# Patient Record
Sex: Female | Born: 1990
Health system: Southern US, Community
[De-identification: ages and names within clinical notes are randomized; demographics above are authoritative.]

## PROBLEM LIST (undated history)

## (undated) ENCOUNTER — Ambulatory Visit (HOSPITAL_COMMUNITY): Payer: Medicaid Other

## (undated) DIAGNOSIS — Z789 Other specified health status: Secondary | ICD-10-CM

## (undated) DIAGNOSIS — I959 Hypotension, unspecified: Secondary | ICD-10-CM

---

## 2017-10-31 ENCOUNTER — Ambulatory Visit: Payer: Self-pay | Admitting: Family Medicine

## 2017-11-13 ENCOUNTER — Ambulatory Visit: Payer: Self-pay | Admitting: Family Medicine

## 2018-09-02 ENCOUNTER — Other Ambulatory Visit: Payer: Self-pay

## 2018-09-02 ENCOUNTER — Inpatient Hospital Stay (HOSPITAL_COMMUNITY)
Admission: AD | Admit: 2018-09-02 | Discharge: 2018-09-02 | Disposition: A | Discharge status: Home / Self Care | Payer: Medicaid Other | Attending: Obstetrics & Gynecology | Admitting: Obstetrics & Gynecology

## 2018-09-02 ENCOUNTER — Inpatient Hospital Stay (HOSPITAL_COMMUNITY): Payer: Medicaid Other

## 2018-09-02 ENCOUNTER — Encounter (HOSPITAL_COMMUNITY): Payer: Self-pay | Admitting: Student

## 2018-09-02 DIAGNOSIS — Z3A01 Less than 8 weeks gestation of pregnancy: Secondary | ICD-10-CM | POA: Diagnosis not present

## 2018-09-02 DIAGNOSIS — O9989 Other specified diseases and conditions complicating pregnancy, childbirth and the puerperium: Secondary | ICD-10-CM | POA: Insufficient documentation

## 2018-09-02 DIAGNOSIS — O209 Hemorrhage in early pregnancy, unspecified: Secondary | ICD-10-CM | POA: Insufficient documentation

## 2018-09-02 DIAGNOSIS — O26851 Spotting complicating pregnancy, first trimester: Secondary | ICD-10-CM | POA: Diagnosis not present

## 2018-09-02 DIAGNOSIS — R1030 Lower abdominal pain, unspecified: Secondary | ICD-10-CM | POA: Diagnosis not present

## 2018-09-02 LAB — CBC
HCT: 37.1 % (ref 36.0–46.0)
Hemoglobin: 12.6 g/dL (ref 12.0–15.0)
MCH: 29.8 pg (ref 26.0–34.0)
MCHC: 34 g/dL (ref 30.0–36.0)
MCV: 87.7 fL (ref 80.0–100.0)
Platelets: 238 10*3/uL (ref 150–400)
RBC: 4.23 MIL/uL (ref 3.87–5.11)
RDW: 11.6 % (ref 11.5–15.5)
WBC: 8.4 10*3/uL (ref 4.0–10.5)
nRBC: 0 % (ref 0.0–0.2)

## 2018-09-02 LAB — WET PREP, GENITAL
Clue Cells Wet Prep HPF POC: NONE SEEN
Sperm: NONE SEEN
Trich, Wet Prep: NONE SEEN
Yeast Wet Prep HPF POC: NONE SEEN

## 2018-09-02 LAB — HCG, QUANTITATIVE, PREGNANCY: hCG, Beta Chain, Quant, S: 7878 m[IU]/mL — ABNORMAL HIGH (ref <5)

## 2018-09-02 LAB — POCT PREGNANCY, URINE: Preg Test, Ur: POSITIVE — AB

## 2018-09-02 NOTE — MAU Provider Note (Signed)
Chief Complaint: Vaginal Bleeding and Possible Pregnancy   First Provider Initiated Contact with Patient 09/02/18 1843     SUBJECTIVE HPI: Dawn Bullock is a 28 y.o. G2P1001 at [redacted]w[redacted]d who presents to Maternity Admissions reporting vaginal bleeding. Symptoms started this afternoon. Reports brown blood on toilet paper when she wipes. Not bleeding into a pad or passing clots. Also reports mild lower abdominal cramping. No recent intercourse.   Location: abdomen Quality: cramping Severity: 4/10 on pain scale Duration: 2 days Timing: intermittent Modifying factors: none Associated signs and symptoms: vaginal bleeding  History reviewed. No pertinent past medical history. OB History  Gravida Para Term Preterm AB Living  2 1 1     1   SAB TAB Ectopic Multiple Live Births          1    # Outcome Date GA Lbr Len/2nd Weight Sex Delivery Anes PTL Lv  2 Current           1 Term 2016     Vag-Spont   LIV   History reviewed. No pertinent surgical history. Social History   Socioeconomic History  . Marital status: Married    Spouse name: Not on file  . Number of children: Not on file  . Years of education: Not on file  . Highest education level: Not on file  Occupational History  . Not on file  Social Needs  . Financial resource strain: Not on file  . Food insecurity    Worry: Not on file    Inability: Not on file  . Transportation needs    Medical: Not on file    Non-medical: Not on file  Tobacco Use  . Smoking status: Never Smoker  . Smokeless tobacco: Never Used  Substance and Sexual Activity  . Alcohol use: Not Currently  . Drug use: Not Currently  . Sexual activity: Not Currently  Lifestyle  . Physical activity    Days per week: Not on file    Minutes per session: Not on file  . Stress: Not on file  Relationships  . Social Herbalist on phone: Not on file    Gets together: Not on file    Attends religious service: Not on file    Active member of club  or organization: Not on file    Attends meetings of clubs or organizations: Not on file    Relationship status: Not on file  . Intimate partner violence    Fear of current or ex partner: Not on file    Emotionally abused: Not on file    Physically abused: Not on file    Forced sexual activity: Not on file  Other Topics Concern  . Not on file  Social History Narrative  . Not on file   History reviewed. No pertinent family history. No current facility-administered medications on file prior to encounter.    No current outpatient medications on file prior to encounter.   No Known Allergies  I have reviewed patient's Past Medical Hx, Surgical Hx, Family Hx, Social Hx, medications and allergies.   Review of Systems  Constitutional: Negative.   Gastrointestinal: Positive for abdominal pain. Negative for constipation, diarrhea, nausea and vomiting.  Genitourinary: Positive for vaginal bleeding. Negative for dysuria and vaginal discharge.    OBJECTIVE Patient Vitals for the past 24 hrs:  BP Temp Pulse Resp SpO2 Weight  09/02/18 1957 119/63 - 76 - - -  09/02/18 1827 124/65 - - - - -  09/02/18  1825 (!) 147/81 98 F (36.7 C) 92 16 100 % 69.4 kg   Constitutional: Well-developed, well-nourished female in no acute distress.  Cardiovascular: normal rate & rhythm, no murmur Respiratory: normal rate and effort. Lung sounds clear throughout GI: Abd soft, non-tender, Pos BS x 4. No guarding or rebound tenderness MS: Extremities nontender, no edema, normal ROM Neurologic: Alert and oriented x 4.  GU:     SPECULUM EXAM: NEFG, minimal amount of brown mucoid blood. No active bleeding. Cervix pink/smooth  BIMANUAL: No CMT. cervix closed; uterus normal size, no adnexal tenderness or masses.    LAB RESULTS Results for orders placed or performed during the hospital encounter of 09/02/18 (from the past 24 hour(s))  Pregnancy, urine POC     Status: Abnormal   Collection Time: 09/02/18  5:37 PM   Result Value Ref Range   Preg Test, Ur POSITIVE (A) NEGATIVE  CBC     Status: None   Collection Time: 09/02/18  6:52 PM  Result Value Ref Range   WBC 8.4 4.0 - 10.5 K/uL   RBC 4.23 3.87 - 5.11 MIL/uL   Hemoglobin 12.6 12.0 - 15.0 g/dL   HCT 16.137.1 09.636.0 - 04.546.0 %   MCV 87.7 80.0 - 100.0 fL   MCH 29.8 26.0 - 34.0 pg   MCHC 34.0 30.0 - 36.0 g/dL   RDW 40.911.6 81.111.5 - 91.415.5 %   Platelets 238 150 - 400 K/uL   nRBC 0.0 0.0 - 0.2 %  ABO/Rh     Status: None   Collection Time: 09/02/18  6:52 PM  Result Value Ref Range   ABO/RH(D)      B POS Performed at [facility], [address] [place], [place] [address]   hCG, quantitative, pregnancy     Status: Abnormal   Collection Time: 09/02/18  6:52 PM  Result Value Ref Range   hCG, Beta Chain, Quant, S [phone] (H) <5 mIU/mL  Wet prep, genital     Status: Abnormal   Collection Time: 09/02/18  7:03 PM   Specimen: Cervix  Result Value Ref Range   Yeast Wet Prep HPF POC NONE SEEN NONE SEEN   Trich, Wet Prep NONE SEEN NONE SEEN   Clue Cells Wet Prep HPF POC NONE SEEN NONE SEEN   WBC, Wet Prep HPF POC MANY (A) NONE SEEN   Sperm NONE SEEN     IMAGING [place] Ob Less Than 14 Weeks With Ob Transvaginal  Result Date: 09/02/2018 CLINICAL DATA:  First trimester pregnancy with vaginal bleeding today. LMP 07/26/2018. Beta HCG levels are pending. EXAM: OBSTETRIC <14 WK US AND TRANSVAGINAL OB US TECHNIQUE: Both transabdominal and transvaginal ultrasound examinations were performed for complete evaluation of the gestation as well as the maternal uterus, adnexal regions, and pelvic cul-de-sac. Transvaginal technique was performed to assess early pregnancy. COMPARISON:  None. FINDINGS: Intrauterine gestational sac: Single. Located in the right fundal region. Yolk sac:  Visualized. Embryo:  Not visualized. Cardiac Activity: Not visualized. MSD: 7.6 mm   5 w   3 d Subchorionic hemorrhage:  None visualized. Maternal uterus/adnexae: No evidence of adnexal mass. The  right ovary appears normal. The left ovary is not visualized. No free pelvic fluid mass. IMPRESSION: 1. Probable early intrauterine gestational sac with a yolk sac, but no fetal pole or cardiac activity yet visualized. Recommend follow-up quantitative B-HCG levels and follow-up US in 14 days to assess viability. This recommendation follows SRU consensus guidelines: Diagnostic Criteria for Nonviable Pregnancy Early in  the First Trimester. Malva Limes Engl J Med 2013; 161:0960-45; 369:1443-51. 2. The gestational sac is located in the right fundal region, and attention to specific location on follow-up recommended. Electronically Signed   By: Carey BullocksWilliam  Veazey M.D.   On: 09/02/2018 19:39    MAU COURSE Orders Placed This Encounter  Procedures  . Wet prep, genital  . US OB LESS THAN 14 WEEKS WITH OB TRANSVAGINAL  . US OB Transvaginal  . CBC  . hCG, quantitative, pregnancy  . POC Urine Pregnancy, ED (not at [facility])  . Pregnancy, urine POC  . ABO/Rh  . Discharge patient   No orders of the defined types were placed in this encounter.   MDM +UPT UA, wet prep, GC/chlamydia, CBC, ABO/Rh, quant hCG, and US today to rule out ectopic pregnancy  RH positive  Ultrasound shows IUGS with yolk sac  ASSESSMENT 1. Vaginal bleeding in pregnancy, first trimester     PLAN Discharge home in stable condition. Bleeding precautions F/u for outpatient ultrasound in 2 weeks  Follow-up Information    [facility] Follow up.   Specialty: Radiology Why: will call you to schedule ultrasound in 2 weeks Contact information: [address], [address] [id number] [place] [phone]       [facility] Follow up.   Specialty: Obstetrics and Gynecology Why: Return for worsening symptoms Contact information: [address] [id number] [place] [id number] [phone]         Allergies as of 09/02/2018   No Known  Allergies     Medication List    You have not been prescribed any medications.      Judeth HornLawrence, Darryl Willner, NP 09/02/2018  8:04 PM

## 2018-09-02 NOTE — Discharge Instructions (Signed)
Vaginal Bleeding During Pregnancy, First Trimester ° °A small amount of bleeding from the vagina (spotting) is relatively common during early pregnancy. It usually stops on its own. Various things may cause bleeding or spotting during early pregnancy. Some bleeding may be related to the pregnancy, and some may not. In many cases, the bleeding is normal and is not a problem. However, bleeding can also be a sign of something serious. Be sure to tell your health care provider about any vaginal bleeding right away. °Some possible causes of vaginal bleeding during the first trimester include: °· Infection or inflammation of the cervix. °· Growths (polyps) on the cervix. °· Miscarriage or threatened miscarriage. °· Pregnancy tissue developing outside of the uterus (ectopic pregnancy). °· A mass of tissue developing in the uterus due to an egg being fertilized incorrectly (molar pregnancy). °Follow these instructions at home: °Activity °· Follow instructions from your health care provider about limiting your activity. Ask what activities are safe for you. °· If needed, make plans for someone to help with your regular activities. °· Do not have sex or orgasms until your health care provider says that this is safe. °General instructions °· Take over-the-counter and prescription medicines only as told by your health care provider. °· Pay attention to any changes in your symptoms. °· Do not use tampons or douche. °· Write down how many pads you use each day, how often you change pads, and how soaked (saturated) they are. °· If you pass any tissue from your vagina, save the tissue so you can show it to your health care provider. °· Keep all follow-up visits as told by your health care provider. This is important. °Contact a health care provider if: °· You have vaginal bleeding during any part of your pregnancy. °· You have cramps or labor pains. °· You have a fever. °Get help right away if: °· You have severe cramps in your  back or abdomen. °· You pass large clots or a large amount of tissue from your vagina. °· Your bleeding increases. °· You feel light-headed or weak, or you faint. °· You have chills. °· You are leaking fluid or have a gush of fluid from your vagina. °Summary °· A small amount of bleeding (spotting) from the vagina is relatively common during early pregnancy. °· Various things may cause bleeding or spotting in early pregnancy. °· Be sure to tell your health care provider about any vaginal bleeding right away. °This information is not intended to replace advice given to you by your health care provider. Make sure you discuss any questions you have with your health care provider. °Document Released: 12/05/2004 Document Revised: 05/30/2016 Document Reviewed: 05/30/2016 °Elsevier Interactive Patient Education © 2019 Elsevier Inc. ° °

## 2018-09-02 NOTE — MAU Note (Signed)
.   Dawn Bullock is a 28 y.o. at [redacted]w[redacted]d here in MAU reporting: vaginal bleeding that started today. Positive pregnancy test last week. LMP:07/26/18  Onset of complaint: today Pain score: 4 Vitals:   09/02/18 1825  BP: (!) 147/81  Pulse: 92  Resp: 16  Temp: 98 F (36.7 C)      Lab orders placed from triage:

## 2018-09-02 NOTE — ED Notes (Signed)
Hand off given to MAU 

## 2018-09-02 NOTE — ED Notes (Signed)
Pt states she [redacted] weeks pregnant with positive home pregnancy test, pt began having vaginal bleeding.

## 2018-09-03 ENCOUNTER — Other Ambulatory Visit: Payer: Self-pay

## 2018-09-03 ENCOUNTER — Inpatient Hospital Stay (HOSPITAL_COMMUNITY): Payer: Medicaid Other

## 2018-09-03 ENCOUNTER — Inpatient Hospital Stay (HOSPITAL_COMMUNITY)
Admission: EM | Admit: 2018-09-03 | Discharge: 2018-09-03 | Discharge status: Against Medical Advice | Payer: Self-pay | Attending: Obstetrics and Gynecology | Admitting: Obstetrics and Gynecology

## 2018-09-03 ENCOUNTER — Inpatient Hospital Stay (HOSPITAL_COMMUNITY)
Admission: AD | Admit: 2018-09-03 | Discharge: 2018-09-03 | Disposition: A | Discharge status: Home / Self Care | Payer: Medicaid Other | Attending: Obstetrics and Gynecology | Admitting: Obstetrics and Gynecology

## 2018-09-03 ENCOUNTER — Encounter (HOSPITAL_COMMUNITY): Payer: Self-pay | Admitting: *Deleted

## 2018-09-03 DIAGNOSIS — O208 Other hemorrhage in early pregnancy: Secondary | ICD-10-CM | POA: Diagnosis not present

## 2018-09-03 DIAGNOSIS — Z3A01 Less than 8 weeks gestation of pregnancy: Secondary | ICD-10-CM

## 2018-09-03 DIAGNOSIS — O26891 Other specified pregnancy related conditions, first trimester: Secondary | ICD-10-CM

## 2018-09-03 DIAGNOSIS — O468X1 Other antepartum hemorrhage, first trimester: Secondary | ICD-10-CM

## 2018-09-03 DIAGNOSIS — O209 Hemorrhage in early pregnancy, unspecified: Secondary | ICD-10-CM | POA: Diagnosis present

## 2018-09-03 DIAGNOSIS — O26899 Other specified pregnancy related conditions, unspecified trimester: Secondary | ICD-10-CM

## 2018-09-03 DIAGNOSIS — R102 Pelvic and perineal pain: Secondary | ICD-10-CM

## 2018-09-03 DIAGNOSIS — Z603 Acculturation difficulty: Secondary | ICD-10-CM

## 2018-09-03 DIAGNOSIS — O418X10 Other specified disorders of amniotic fluid and membranes, first trimester, not applicable or unspecified: Secondary | ICD-10-CM

## 2018-09-03 LAB — URINALYSIS, ROUTINE W REFLEX MICROSCOPIC
Bacteria, UA: NONE SEEN
Bilirubin Urine: NEGATIVE
Glucose, UA: NEGATIVE mg/dL
Ketones, ur: NEGATIVE mg/dL
Leukocytes,Ua: NEGATIVE
Nitrite: NEGATIVE
Protein, ur: NEGATIVE mg/dL
RBC / HPF: >50 RBC/hpf — ABNORMAL HIGH (ref 0–5)
Specific Gravity, Urine: 1.02 (ref 1.005–1.030)
pH: 5 (ref 5.0–8.0)

## 2018-09-03 LAB — GC/CHLAMYDIA PROBE AMP (~~LOC~~) NOT AT ARMC
Chlamydia: NEGATIVE
Neisseria Gonorrhea: NEGATIVE

## 2018-09-03 LAB — ABO/RH: ABO/RH(D): B POS

## 2018-09-03 NOTE — MAU Note (Signed)
Not in lobby x 3. Assume pt eloped.

## 2018-09-03 NOTE — MAU Note (Signed)
Not in lobby x2.

## 2018-09-03 NOTE — Discharge Instructions (Signed)
Vaginal Bleeding During Pregnancy, First Trimester    A small amount of bleeding (spotting) from the vagina is common during early pregnancy. Sometimes the bleeding is normal and does not cause problems. At other times, though, bleeding may be a sign of something serious. Tell your doctor about any bleeding from your vagina right away.  Follow these instructions at home:  Activity  · Follow your doctor's instructions about how active you can be.  · If needed, make plans for someone to help with your normal activities.  · Do not have sex or orgasms until your doctor says that this is safe.  General instructions  · Take over-the-counter and prescription medicines only as told by your doctor.  · Watch your condition for any changes.  · Write down:  ? The number of pads you use each day.  ? How often you change pads.  ? How soaked (saturated) your pads are.  · Do not use tampons.  · Do not douche.  · If you pass any tissue from your vagina, save it to show to your doctor.  · Keep all follow-up visits as told by your doctor. This is important.  Contact a doctor if:  · You have vaginal bleeding at any time while you are pregnant.  · You have cramps.  · You have a fever.  Get help right away if:  · You have very bad cramps in your back or belly (abdomen).  · You pass large clots or a lot of tissue from your vagina.  · Your bleeding gets worse.  · You feel light-headed.  · You feel weak.  · You pass out (faint).  · You have chills.  · You are leaking fluid from your vagina.  · You have a gush of fluid from your vagina.  Summary  · Sometimes vaginal bleeding during pregnancy is normal and does not cause problems. At other times, bleeding may be a sign of something serious.  · Tell your doctor about any bleeding from your vagina right away.  · Follow your doctor's instructions about how active you can be. You may need someone to help you with your normal activities.  This information is not intended to replace advice given to  you by your health care provider. Make sure you discuss any questions you have with your health care provider.  Document Released: 07/12/2013 Document Revised: 05/29/2016 Document Reviewed: 05/29/2016  Elsevier Interactive Patient Education © 2019 Elsevier Inc.

## 2018-09-03 NOTE — MAU Note (Addendum)
Pt was here yesterday with an early pregnancy and some spotting. She had an U/S and was instructed to return in two weeks or sooner if she had any more bleeding. She says bleeding increased this morning and says it is still as heavy; she also c/o lower abd pain and lower back cramps since last night .   2030- M. Williams CNM at bedside explaining the plan of care through an arabic interpeteur  2205- M. Williams CNM in at bedside discussing u/s results via arabic interpreteur.

## 2018-09-03 NOTE — MAU Note (Signed)
Not in lobby x1  

## 2018-09-03 NOTE — MAU Provider Note (Signed)
Chief Complaint: No chief complaint on file.   First Provider Initiated Contact with Patient 09/03/18 2018        SUBJECTIVE HPI: Dawn LamasRuqaya Najmuldee Bullock is a 28 y.o. G2P1001 at 648w4d by LMP who presents to maternity admissions reporting persistent vaginal bleeding with small clots.  Also has pelvic cramps. . She denies vaginal itching/burning, urinary symptoms, h/a, dizziness, n/v, or fever/chills.    Was seen here yesterday for the same, and single IUGS with yolk sac was seen  She is scheduled for repeat US in 2 weeks   Has not started care yet.  Is from [place]   RN Note: Pt was here yesterday with an early pregnancy and some spotting. She had an U/S and was instructed to return in two weeks or sooner if she had any more bleeding. She says bleeding increased this morning and says it is still as heavy; she also c/o lower abd pain and lower back cramps since last night .    History reviewed. No pertinent past medical history. Past Surgical History:  Procedure Laterality Date  . NO PAST SURGERIES     Social History   Socioeconomic History  . Marital status: Married    Spouse name: Not on file  . Number of children: Not on file  . Years of education: Not on file  . Highest education level: Not on file  Occupational History  . Not on file  Social Needs  . Financial resource strain: Not on file  . Food insecurity    Worry: Not on file    Inability: Not on file  . Transportation needs    Medical: Not on file    Non-medical: Not on file  Tobacco Use  . Smoking status: Never Smoker  . Smokeless tobacco: Never Used  Substance and Sexual Activity  . Alcohol use: Not Currently  . Drug use: Not Currently  . Sexual activity: Not Currently    Comment: had sex 3 weeks ago  Lifestyle  . Physical activity    Days per week: Not on file    Minutes per session: Not on file  . Stress: Not on file  Relationships  . Social Musicianconnections    Talks on phone: Not on file    Gets together: Not  on file    Attends religious service: Not on file    Active member of club or organization: Not on file    Attends meetings of clubs or organizations: Not on file    Relationship status: Not on file  . Intimate partner violence    Fear of current or ex partner: Not on file    Emotionally abused: Not on file    Physically abused: Not on file    Forced sexual activity: Not on file  Other Topics Concern  . Not on file  Social History Narrative  . Not on file   No current facility-administered medications on file prior to encounter.    No current outpatient medications on file prior to encounter.   No Known Allergies  I have reviewed patient's Past Medical Hx, Surgical Hx, Family Hx, Social Hx, medications and allergies.   ROS:  Review of Systems  Constitutional: Negative for chills and fever.  Respiratory: Negative for shortness of breath.   Gastrointestinal: Negative for constipation, diarrhea and nausea.  Genitourinary: Positive for pelvic pain and vaginal bleeding.  Musculoskeletal: Negative for back pain.   Review of Systems  Other systems negative   Physical Exam  Physical Exam  Patient Vitals for the past 24 hrs:  BP Pulse Resp Height Weight  09/03/18 2017 - - - 5' 6.54" (1.69 m) 69 kg  09/03/18 2013 132/64 84 15 - -   Constitutional: Well-developed, well-nourished female in no acute distress.  Cardiovascular: normal rate Respiratory: normal effort GI: Abd soft, non-tender. Pos BS x 4 MS: Extremities nontender, no edema, normal ROM Neurologic: Alert and oriented x 4.  GU: Neg CVAT.  PELVIC EXAM: small to moderate vag bleeding.  Extensive pelvic exam deferred due to recent exam   LAB RESULTS Results for orders placed or performed during the hospital encounter of 09/03/18 (from the past 24 hour(s))  Urinalysis, Routine w reflex microscopic     Status: Abnormal   Collection Time: 09/03/18  8:39 PM  Result Value Ref Range   Color, Urine YELLOW YELLOW    APPearance HAZY (A) CLEAR   Specific Gravity, Urine 1.020 1.005 - 1.030   pH 5.0 5.0 - 8.0   Glucose, UA NEGATIVE NEGATIVE mg/dL   Hgb urine dipstick LARGE (A) NEGATIVE   Bilirubin Urine NEGATIVE NEGATIVE   Ketones, ur NEGATIVE NEGATIVE mg/dL   Protein, ur NEGATIVE NEGATIVE mg/dL   Nitrite NEGATIVE NEGATIVE   Leukocytes,Ua NEGATIVE NEGATIVE   RBC / HPF >50 (H) 0 - 5 RBC/hpf   WBC, UA 0-5 0 - 5 WBC/hpf   Bacteria, UA NONE SEEN NONE SEEN   Squamous Epithelial / LPF 0-5 0 - 5   Mucus PRESENT    Ca Oxalate Crys, UA PRESENT      --/--/B POS (06/24 1852)  IMAGING US Ob Comp Less 14 Wks  Result Date: 09/03/2018 CLINICAL DATA:  Bleeding and cramping. EXAM: OBSTETRIC <14 WK [place] AND TRANSVAGINAL OB US TECHNIQUE: Both transabdominal and transvaginal ultrasound examinations were performed for complete evaluation of the gestation as well as the maternal uterus, adnexal regions, and pelvic cul-de-sac. Transvaginal technique was performed to assess early pregnancy. COMPARISON:  September 02, 2018 FINDINGS: Intrauterine gestational sac: Single Yolk sac:  Visualized. Embryo:  Not Visualized. Cardiac Activity: Not Visualized. MSD: 8.36 mm   5 w   4 d Subchorionic hemorrhage: There is a moderate amount of subchorionic hemorrhage. Maternal uterus/adnexae: The left ovary was not visualized. The right ovary is unremarkable. There is no significant free fluid. IMPRESSION: 1. Probable early intrauterine gestational sac, but no fetal pole, or cardiac activity yet visualized. Recommend follow-up quantitative B-HCG levels and follow-up US in 14 days to assess viability. This recommendation follows SRU consensus guidelines: Diagnostic Criteria for Nonviable Pregnancy Early in the First Trimester. [place] Med 2013; 174:0814-48. 2.  New subchorionic hemorrhage is noted. Electronically Signed   By: Constance Holster M.D.   On: 09/03/2018 21:19     MAU Management/MDM: Ordered Ultrasound to rule out SAB Resulted back with  similar appearance but new visualization of Moderate [place] Used Ipad interpretor to explain results DIscussed we cannot predict future outcome, nor can we correct bleeding Pelvic rest recommended Keep appt for [place] in 2 weeks.   This bleeding/pain can represent a normal pregnancy with bleeding or spontaneous abortion  The process as listed above helps to determine which of these is present.    ASSESSMENT Pregnancy at [redacted]w[redacted]d Bleeding in pregnancy Subchorionic hemorrhage  PLAN Discharge home Plan to repeat Ultrasound in about 14 days  SAB precautions Bleeding precautions Pt stable at time of discharge. Encouraged to return here or to other Urgent Care/ED if she develops worsening of symptoms, increase in pain, fever,  or other concerning symptoms.    Wynelle BourgeoisMarie Mychal Durio CNM, MSN Certified Nurse-Midwife 09/03/2018  8:18 PM

## 2018-09-04 DIAGNOSIS — O418X10 Other specified disorders of amniotic fluid and membranes, first trimester, not applicable or unspecified: Secondary | ICD-10-CM

## 2018-09-04 DIAGNOSIS — O209 Hemorrhage in early pregnancy, unspecified: Secondary | ICD-10-CM | POA: Diagnosis present

## 2018-09-04 DIAGNOSIS — O468X1 Other antepartum hemorrhage, first trimester: Secondary | ICD-10-CM

## 2018-09-04 DIAGNOSIS — Z603 Acculturation difficulty: Secondary | ICD-10-CM

## 2018-09-09 ENCOUNTER — Telehealth: Payer: Self-pay | Admitting: Obstetrics & Gynecology

## 2018-09-09 NOTE — Telephone Encounter (Signed)
The patient's husband called to start his wife's prenatal care. Explained how the scheduling process works with the new ob appointment. Also sending a reminder letter with updated appointment information.

## 2018-09-10 ENCOUNTER — Encounter (HOSPITAL_COMMUNITY): Payer: Self-pay | Admitting: *Deleted

## 2018-09-10 ENCOUNTER — Other Ambulatory Visit: Payer: Self-pay

## 2018-09-10 ENCOUNTER — Inpatient Hospital Stay (HOSPITAL_COMMUNITY): Payer: Medicaid Other

## 2018-09-10 ENCOUNTER — Inpatient Hospital Stay (HOSPITAL_COMMUNITY)
Admission: AD | Admit: 2018-09-10 | Discharge: 2018-09-11 | Disposition: A | Discharge status: Home / Self Care | Payer: Medicaid Other | Attending: Obstetrics & Gynecology | Admitting: Obstetrics & Gynecology

## 2018-09-10 DIAGNOSIS — Z3A01 Less than 8 weeks gestation of pregnancy: Secondary | ICD-10-CM | POA: Diagnosis not present

## 2018-09-10 DIAGNOSIS — O418X10 Other specified disorders of amniotic fluid and membranes, first trimester, not applicable or unspecified: Secondary | ICD-10-CM

## 2018-09-10 DIAGNOSIS — N939 Abnormal uterine and vaginal bleeding, unspecified: Secondary | ICD-10-CM

## 2018-09-10 DIAGNOSIS — O208 Other hemorrhage in early pregnancy: Secondary | ICD-10-CM | POA: Insufficient documentation

## 2018-09-10 DIAGNOSIS — R109 Unspecified abdominal pain: Secondary | ICD-10-CM | POA: Diagnosis not present

## 2018-09-10 DIAGNOSIS — O26891 Other specified pregnancy related conditions, first trimester: Secondary | ICD-10-CM | POA: Diagnosis not present

## 2018-09-10 DIAGNOSIS — O468X1 Other antepartum hemorrhage, first trimester: Secondary | ICD-10-CM | POA: Diagnosis not present

## 2018-09-10 DIAGNOSIS — O209 Hemorrhage in early pregnancy, unspecified: Secondary | ICD-10-CM | POA: Diagnosis present

## 2018-09-10 HISTORY — DX: Other specified health status: Z78.9

## 2018-09-10 NOTE — MAU Note (Addendum)
Pt reports to MAU c/o lower abdominal pain that is a 5/10 on the pain scale, pt states it is a menstrual cramp. Pt also is reporting vaginal bleeding pt reports using 1 pad since 9pm. Pt says the bleeding is red.  Pt reports some dizziness.

## 2018-09-11 DIAGNOSIS — R109 Unspecified abdominal pain: Secondary | ICD-10-CM | POA: Diagnosis not present

## 2018-09-11 DIAGNOSIS — Z3A01 Less than 8 weeks gestation of pregnancy: Secondary | ICD-10-CM | POA: Diagnosis not present

## 2018-09-11 DIAGNOSIS — N939 Abnormal uterine and vaginal bleeding, unspecified: Secondary | ICD-10-CM | POA: Diagnosis not present

## 2018-09-11 DIAGNOSIS — O26891 Other specified pregnancy related conditions, first trimester: Secondary | ICD-10-CM | POA: Diagnosis not present

## 2018-09-11 DIAGNOSIS — O418X10 Other specified disorders of amniotic fluid and membranes, first trimester, not applicable or unspecified: Secondary | ICD-10-CM

## 2018-09-11 DIAGNOSIS — O468X1 Other antepartum hemorrhage, first trimester: Secondary | ICD-10-CM

## 2018-09-11 DIAGNOSIS — O208 Other hemorrhage in early pregnancy: Secondary | ICD-10-CM | POA: Diagnosis not present

## 2018-09-11 LAB — HCG, QUANTITATIVE, PREGNANCY: hCG, Beta Chain, Quant, S: 34671 m[IU]/mL — ABNORMAL HIGH (ref <5)

## 2018-09-11 LAB — CBC WITH DIFFERENTIAL/PLATELET
Abs Immature Granulocytes: 0.04 10*3/uL (ref 0.00–0.07)
Basophils Absolute: 0 10*3/uL (ref 0.0–0.1)
Basophils Relative: 0 %
Eosinophils Absolute: 0.1 10*3/uL (ref 0.0–0.5)
Eosinophils Relative: 1 %
HCT: 34.3 % — ABNORMAL LOW (ref 36.0–46.0)
Hemoglobin: 12 g/dL (ref 12.0–15.0)
Immature Granulocytes: 1 %
Lymphocytes Relative: 26 %
Lymphs Abs: 2.2 10*3/uL (ref 0.7–4.0)
MCH: 30.3 pg (ref 26.0–34.0)
MCHC: 35 g/dL (ref 30.0–36.0)
MCV: 86.6 fL (ref 80.0–100.0)
Monocytes Absolute: 0.6 10*3/uL (ref 0.1–1.0)
Monocytes Relative: 7 %
Neutro Abs: 5.5 10*3/uL (ref 1.7–7.7)
Neutrophils Relative %: 65 %
Platelets: 225 10*3/uL (ref 150–400)
RBC: 3.96 MIL/uL (ref 3.87–5.11)
RDW: 11.4 % — ABNORMAL LOW (ref 11.5–15.5)
WBC: 8.3 10*3/uL (ref 4.0–10.5)
nRBC: 0 % (ref 0.0–0.2)

## 2018-09-11 MED ORDER — ACETAMINOPHEN 325 MG PO TABS
650.0000 mg | ORAL_TABLET | Freq: Once | ORAL | Status: AC
  Administered 2018-09-11: 650 mg via ORAL
  Filled 2018-09-11: qty 2

## 2018-09-11 MED ORDER — DOXYLAMINE SUCCINATE (SLEEP) 25 MG PO TABS: 25.0000 mg | ORAL_TABLET | Freq: Three times a day (TID) | ORAL | 0 refills | Status: DC | PRN

## 2018-09-11 MED ORDER — PYRIDOXINE HCL 25 MG PO TABS: 25.0000 mg | ORAL_TABLET | Freq: Three times a day (TID) | ORAL | 0 refills | Status: DC

## 2018-09-11 NOTE — Discharge Instructions (Signed)
Eating Plan for Pregnant Women °While you are pregnant, your body requires additional nutrition to help support your growing baby. You also have a higher need for some vitamins and minerals, such as folic acid, calcium, iron, and vitamin D. Eating a healthy, well-balanced diet is very important for your health and your baby's health. Your need for extra calories varies for the three 3-month segments of your pregnancy (trimesters). For most women, it is recommended to consume: °· 150 extra calories a day during the first trimester. °· 300 extra calories a day during the second trimester. °· 300 extra calories a day during the third trimester. °What are tips for following this plan? ° °· Do not try to lose weight or go on a diet during pregnancy. °· Limit your overall intake of foods that have "empty calories." These are foods that have little nutritional value, such as sweets, desserts, candies, and sugar-sweetened beverages. °· Eat a variety of foods (especially fruits and vegetables) to get a full range of vitamins and minerals. °· Take a prenatal vitamin to help meet your additional vitamin and mineral needs during pregnancy, specifically for folic acid, iron, calcium, and vitamin D. °· Remember to stay active. Ask your health care provider what types of exercise and activities are safe for you. °· Practice good food safety and cleanliness. Wash your hands before you eat and after you prepare raw meat. Wash all fruits and vegetables well before peeling or eating. Taking these actions can help to prevent food-borne illnesses that can be very dangerous to your baby, such as listeriosis. Ask your health care provider for more information about listeriosis. °What does 150 extra calories look like? °Healthy options that provide 150 extra calories each day could be any of the following: °· 6-8 oz (170-230 g) of plain low-fat yogurt with ½ cup of berries. °· 1 apple with 2 teaspoons (11 g) of peanut butter. °· Cut-up  vegetables with ¼ cup (60 g) of hummus. °· 8 oz (230 mL) or 1 cup of low-fat chocolate milk. °· 1 stick of string cheese with 1 medium orange. °· 1 peanut butter and jelly sandwich that is made with one slice of whole-wheat bread and 1 tsp (5 g) of peanut butter. °For 300 extra calories, you could eat two of those healthy options each day. °What is a healthy amount of weight to gain? °The right amount of weight gain for you is based on your BMI before you became pregnant. If your BMI: °· Was less than 18 (underweight), you should gain 28-40 lb (13-18 kg). °· Was 18-24.9 (normal), you should gain 25-35 lb (11-16 kg). °· Was 25-29.9 (overweight), you should gain 15-25 lb (7-11 kg). °· Was 30 or greater (obese), you should gain 11-20 lb (5-9 kg). °What if I am having twins or multiples? °Generally, if you are carrying twins or multiples: °· You may need to eat 300-600 extra calories a day. °· The recommended range for total weight gain is 25-54 lb (11-25 kg), depending on your BMI before pregnancy. °· Talk with your health care provider to find out about nutritional needs, weight gain, and exercise that is right for you. °What foods can I eat? ° °Grains °All grains. Choose whole grains, such as whole-wheat bread, oatmeal, or brown rice. °Vegetables °All vegetables. Eat a variety of colors and types of vegetables. Remember to wash your vegetables well before peeling or eating. °Fruits °All fruits. Eat a variety of colors and types of fruit. Remember to wash   your fruits well before peeling or eating. °Meats and other protein foods °Lean meats, including chicken, turkey, fish, and lean cuts of beef, veal, or pork. If you eat fish or seafood, choose options that are higher in omega-3 fatty acids and lower in mercury, such as salmon, herring, mussels, trout, sardines, pollock, shrimp, crab, and lobster. Tofu. Tempeh. Beans. Eggs. Peanut butter and other nut butters. Make sure that all meats, poultry, and eggs are cooked to  food-safe temperatures or "well-done." °Two or more servings of fish are recommended each week in order to get the most benefits from omega-3 fatty acids that are found in seafood. Choose fish that are lower in mercury. You can find more information online: °· www.fda.gov °Dairy °Pasteurized milk and milk alternatives (such as almond milk). Pasteurized yogurt and pasteurized cheese. Cottage cheese. Sour cream. °Beverages °Water. Juices that contain 100% fruit juice or vegetable juice. Caffeine-free teas and decaffeinated coffee. °Drinks that contain caffeine are okay to drink, but it is better to avoid caffeine. Keep your total caffeine intake to less than 200 mg each day (which is 12 oz or 355 mL of coffee, tea, or soda) or the limit as told by your health care provider. °Fats and oils °Fats and oils are okay to include in moderation. °Sweets and desserts °Sweets and desserts are okay to include in moderation. °Seasoning and other foods °All pasteurized condiments. °The items listed above may not be a complete list of recommended foods and beverages. Contact your dietitian for more options. °The items listed above may not be a complete list of foods and beverages [you/your child] can eat. Contact a dietitian for more information. °What foods are not recommended? °Vegetables °Raw (unpasteurized) vegetable juices. °Fruits °Unpasteurized fruit juices. °Meats and other protein foods °Lunch meats, bologna, hot dogs, or other deli meats. (If you must eat those meats, reheat them until they are steaming hot.) Refrigerated paté, meat spreads from a meat counter, smoked seafood that is found in the refrigerated section of a store. Raw or undercooked meats, poultry, and eggs. Raw fish, such as sushi or sashimi. Fish that have high mercury content, such as tilefish, shark, swordfish, and king mackerel. °To learn more about mercury in fish, talk with your health care provider or look for online resources, such  as: °· www.fda.gov °Dairy °Raw (unpasteurized) milk and any foods that have raw milk in them. Soft cheeses, such as feta, queso blanco, queso fresco, Brie, Camembert cheeses, blue-veined cheeses, and Panela cheese (unless it is made with pasteurized milk, which must be stated on the label). °Beverages °Alcohol. Sugar-sweetened beverages, such as sodas, teas, or energy drinks. °Seasoning and other foods °Homemade fermented foods and drinks, such as pickles, sauerkraut, or kombucha drinks. (Store-bought pasteurized versions of these are okay.) °Salads that are made in a store or deli, such as ham salad, chicken salad, egg salad, tuna salad, and seafood salad. °The items listed above may not be a complete list of foods and beverages to avoid. Contact your dietitian for more information. °The items listed above may not be a complete list of foods and beverages [you/your child] should avoid. Contact a dietitian for more information. °Where to find more information °To calculate the number of calories you need based on your height, weight, and activity level, you can use an online calculator such as: °· www.choosemyplate.gov/MyPlatePlan °To calculate how much weight you should gain during pregnancy, you can use an online pregnancy weight gain calculator such as: °· www.choosemyplate.gov/pregnancy-weight-gain-calculator °Summary °· While you   are pregnant, your body requires additional nutrition to help support your growing baby. °· Eat a variety of foods, especially fruits and vegetables to get a full range of vitamins and minerals. °· Practice good food safety and cleanliness. Wash your hands before you eat and after you prepare raw meat. Wash all fruits and vegetables well before peeling or eating. Taking these actions can help to prevent food-borne illnesses, such as listeriosis, that can be very dangerous to your baby. °· Do not eat raw meat or fish. Do not eat fish that have high mercury content, such as tilefish,  shark, swordfish, and king mackerel. Do not eat unpasteurized (raw) dairy. °· Take a prenatal vitamin to help meet your additional vitamin and mineral needs during pregnancy, specifically for folic acid, iron, calcium, and vitamin D. °This information is not intended to replace advice given to you by your health care provider. Make sure you discuss any questions you have with your health care provider. °Document Released: 12/10/2013 Document Revised: 06/18/2018 Document Reviewed: 11/22/2016 °Elsevier Patient Education © 2020 [employer] Inc. ° °

## 2018-09-11 NOTE — MAU Provider Note (Signed)
History     CSN: [id number]  Arrival date and time: 09/10/18 2313   First Provider Initiated Contact with Patient 09/10/18 2338      Chief Complaint  Patient presents with  . Vaginal Bleeding  . Abdominal Pain   HPI Dawn Bullock is a 28 y.o. G2P1001 at 5923w5d who presents to MAU with chief complaint of heavy vaginal bleeding in the setting of previously diagnosed subchorionic hemorrhage. Patient reports ongoing heavy bleeding since 9pm tonight. She states she has worn one pad since that time and observed three clots.  Patient reports secondary complaint of abdominal pain. This is a new problem, onset this evening. She rates her bilateral lower abdominal pain as 5/10. Her pain does not radiate. She denies aggravating or alleviating factors. She has not taken medication or tried other treatments for this pain.  OB History    Gravida  2   Para  1   Term  1   Preterm      AB      Living  1     SAB      TAB      Ectopic      Multiple      Live Births  1           Past Medical History:  Diagnosis Date  . Medical history non-contributory     Past Surgical History:  Procedure Laterality Date  . NO PAST SURGERIES      No family history on file.  Social History   Tobacco Use  . Smoking status: Never Smoker  . Smokeless tobacco: Never Used  Substance Use Topics  . Alcohol use: Not Currently  . Drug use: Not Currently    Allergies: No Known Allergies  Medications Prior to Admission  Medication Sig Dispense Refill Last Dose  . Prenatal MV & Min w/FA-DHA (ONE A DAY PRENATAL PO) Take by mouth.   09/10/2018 at Unknown time    Review of Systems  Constitutional: Negative for chills, fatigue and fever.  Respiratory: Negative for shortness of breath.   Gastrointestinal: Positive for abdominal pain.  Genitourinary: Positive for vaginal bleeding. Negative for difficulty urinating and dysuria.  Musculoskeletal: Negative for back pain.  Neurological:  Negative for dizziness and headaches.  All other systems reviewed and are negative.  Physical Exam   Blood pressure 116/61, pulse 77, temperature 98.6 F (37 C), temperature source Oral, resp. rate 18, last menstrual period 07/26/2018.  Physical Exam  Nursing note and vitals reviewed. Constitutional: She is oriented to person, place, and time. She appears well-developed and well-nourished.  Cardiovascular: Normal rate.  Respiratory: Effort normal. No respiratory distress.  GI: Soft. She exhibits no distension. There is no abdominal tenderness. There is no rebound and no guarding.  Neurological: She is alert and oriented to person, place, and time.  Skin: Skin is dry.  Psychiatric: She has a normal mood and affect. Her behavior is normal. Judgment and thought content normal.    MAU Course/MDM  Procedures  --Sterile speculum exam declined by patient  Patient Vitals for the past 24 hrs:  BP Temp Temp src Pulse Resp  09/11/18 0035 116/61 98.6 F (37 C) Oral 77 18  09/10/18 2328 118/81 98.5 F (36.9 C) - 86 17    Results for orders placed or performed during the hospital encounter of 09/10/18 (from the past 24 hour(s))  CBC with Differential/Platelet     Status: Abnormal   Collection Time: 09/10/18 11:52 PM  Result  Value Ref Range   WBC 8.3 4.0 - 10.5 K/uL   RBC 3.96 3.87 - 5.11 MIL/uL   Hemoglobin 12.0 12.0 - 15.0 g/dL   HCT 34.3 (L) 36.0 - 46.0 %   MCV 86.6 80.0 - 100.0 fL   MCH 30.3 26.0 - 34.0 pg   MCHC 35.0 30.0 - 36.0 g/dL   RDW 11.4 (L) 11.5 - 15.5 %   Platelets 225 150 - 400 K/uL   nRBC 0.0 0.0 - 0.2 %   Neutrophils Relative % 65 %   Neutro Abs 5.5 1.7 - 7.7 K/uL   Lymphocytes Relative 26 %   Lymphs Abs 2.2 0.7 - 4.0 K/uL   Monocytes Relative 7 %   Monocytes Absolute 0.6 0.1 - 1.0 K/uL   Eosinophils Relative 1 %   Eosinophils Absolute 0.1 0.0 - 0.5 K/uL   Basophils Relative 0 %   Basophils Absolute 0.0 0.0 - 0.1 K/uL   Immature Granulocytes 1 %   Abs  Immature Granulocytes 0.04 0.00 - 0.07 K/uL   US Ob Transvaginal  Result Date: 09/11/2018 CLINICAL DATA:  Vaginal bleeding EXAM: TRANSVAGINAL OB ULTRASOUND TECHNIQUE: Transvaginal ultrasound was performed for complete evaluation of the gestation as well as the maternal uterus, adnexal regions, and pelvic cul-de-sac. COMPARISON:  09/03/2018 FINDINGS: Intrauterine gestational sac: Single Yolk sac:  Visualized Embryo:  Not visualized Cardiac Activity: Not visualized Heart Rate:  bpm MSD: 10.88 mm   5 w   5 d CRL:     mm    w  d                  [place] EDC: Subchorionic hemorrhage:  Large subchorionic hemorrhage Maternal uterus/adnexae: No adnexal mass or free fluid. IMPRESSION: Five week 5 day intrauterine pregnancy based on mean sac diameter. Yolk sac is visualized but no fetal pole. This could be followed with repeat ultrasound in 10-14 days to ensure expected progression. Large subchorionic hemorrhage. Electronically Signed   By: Rolm Baptise M.D.   On: 09/11/2018 00:15   Meds ordered this encounter  Medications  . acetaminophen (TYLENOL) tablet 650 mg  . pyridOXINE (VITAMIN B-6) 25 MG tablet    Sig: Take 1 tablet (25 mg total) by mouth every 8 (eight) hours.    Dispense:  30 tablet    Refill:  0    Order Specific Question:   Supervising Provider    Answer:   Donnamae Jude [[id number]]  . doxylamine, Sleep, (UNISOM) 25 MG tablet    Sig: Take 1 tablet (25 mg total) by mouth every 8 (eight) hours as needed.    Dispense:  30 tablet    Refill:  0    Order Specific Question:   Supervising Provider    Answer:   Donnamae Jude [[id number]]    Assessment and Plan  --28 y.o. G2P1001 at [redacted]w[redacted]d  By LMP --GS + YS, no fetal pole or cardiac activity --Rising quant, new onset nausea --Large subchorionic hemorrhage. Continue pelvic rest as previously advised --Discharge home in stable condition  F/U: Keep repeat US 09/21/18 as previously scheduled  Darlina Rumpf, CNM 09/11/2018, 1:10 AM

## 2018-09-21 ENCOUNTER — Ambulatory Visit (INDEPENDENT_AMBULATORY_CARE_PROVIDER_SITE_OTHER): Payer: Medicaid Other | Admitting: Family Medicine

## 2018-09-21 ENCOUNTER — Other Ambulatory Visit: Payer: Self-pay | Admitting: Obstetrics and Gynecology

## 2018-09-21 ENCOUNTER — Encounter: Payer: Self-pay | Admitting: Family Medicine

## 2018-09-21 ENCOUNTER — Observation Stay
Admission: AD | Admit: 2018-09-21 | Payer: Medicaid Other | Source: Ambulatory Visit | Admitting: Obstetrics and Gynecology

## 2018-09-21 ENCOUNTER — Inpatient Hospital Stay (HOSPITAL_COMMUNITY)
Admission: AD | Admit: 2018-09-21 | Discharge: 2018-09-24 | DRG: 819 | Disposition: A | Discharge status: Home / Self Care | Payer: Medicaid Other | Source: Ambulatory Visit | Attending: Obstetrics and Gynecology | Admitting: Obstetrics and Gynecology

## 2018-09-21 ENCOUNTER — Encounter (HOSPITAL_COMMUNITY): Payer: Self-pay | Admitting: *Deleted

## 2018-09-21 ENCOUNTER — Other Ambulatory Visit: Payer: Self-pay

## 2018-09-21 ENCOUNTER — Ambulatory Visit (HOSPITAL_COMMUNITY)
Admission: RE | Admit: 2018-09-21 | Discharge: 2018-09-21 | Disposition: A | Discharge status: Home / Self Care | Payer: Medicaid Other | Source: Ambulatory Visit | Attending: Student | Admitting: Student

## 2018-09-21 DIAGNOSIS — O008 Other ectopic pregnancy without intrauterine pregnancy: Principal | ICD-10-CM | POA: Diagnosis present

## 2018-09-21 DIAGNOSIS — Z98891 History of uterine scar from previous surgery: Secondary | ICD-10-CM

## 2018-09-21 DIAGNOSIS — Z3A08 8 weeks gestation of pregnancy: Secondary | ICD-10-CM

## 2018-09-21 DIAGNOSIS — O418X10 Other specified disorders of amniotic fluid and membranes, first trimester, not applicable or unspecified: Secondary | ICD-10-CM

## 2018-09-21 DIAGNOSIS — O200 Threatened abortion: Secondary | ICD-10-CM | POA: Diagnosis not present

## 2018-09-21 DIAGNOSIS — O209 Hemorrhage in early pregnancy, unspecified: Secondary | ICD-10-CM

## 2018-09-21 DIAGNOSIS — O468X1 Other antepartum hemorrhage, first trimester: Secondary | ICD-10-CM | POA: Diagnosis not present

## 2018-09-21 DIAGNOSIS — Z1159 Encounter for screening for other viral diseases: Secondary | ICD-10-CM

## 2018-09-21 DIAGNOSIS — Z3A01 Less than 8 weeks gestation of pregnancy: Secondary | ICD-10-CM | POA: Diagnosis not present

## 2018-09-21 DIAGNOSIS — Z9889 Other specified postprocedural states: Secondary | ICD-10-CM

## 2018-09-21 LAB — COMPREHENSIVE METABOLIC PANEL
ALT: 11 U/L (ref 0–44)
AST: 16 U/L (ref 15–41)
Albumin: 4 g/dL (ref 3.5–5.0)
Alkaline Phosphatase: 38 U/L (ref 38–126)
Anion gap: 11 (ref 5–15)
BUN: 6 mg/dL (ref 6–20)
CO2: 22 mmol/L (ref 22–32)
Calcium: 9 mg/dL (ref 8.9–10.3)
Chloride: 103 mmol/L (ref 98–111)
Creatinine, Ser: 0.57 mg/dL (ref 0.44–1.00)
GFR calc Af Amer: >60 mL/min (ref >60)
GFR calc non Af Amer: >60 mL/min (ref >60)
Glucose, Bld: 91 mg/dL (ref 70–99)
Potassium: 3.5 mmol/L (ref 3.5–5.1)
Sodium: 136 mmol/L (ref 135–145)
Total Bilirubin: 0.6 mg/dL (ref 0.3–1.2)
Total Protein: 6.7 g/dL (ref 6.5–8.1)

## 2018-09-21 LAB — CBC
HCT: 31.2 % — ABNORMAL LOW (ref 36.0–46.0)
Hemoglobin: 10.9 g/dL — ABNORMAL LOW (ref 12.0–15.0)
MCH: 30.4 pg (ref 26.0–34.0)
MCHC: 34.9 g/dL (ref 30.0–36.0)
MCV: 86.9 fL (ref 80.0–100.0)
Platelets: 229 10*3/uL (ref 150–400)
RBC: 3.59 MIL/uL — ABNORMAL LOW (ref 3.87–5.11)
RDW: 11.8 % (ref 11.5–15.5)
WBC: 10.8 10*3/uL — ABNORMAL HIGH (ref 4.0–10.5)
nRBC: 0 % (ref 0.0–0.2)

## 2018-09-21 LAB — PREPARE RBC (CROSSMATCH)

## 2018-09-21 MED ORDER — ZOLPIDEM TARTRATE 5 MG PO TABS: 5.0000 mg | ORAL_TABLET | Freq: Every evening | ORAL | Status: DC | PRN

## 2018-09-21 MED ORDER — SODIUM CHLORIDE 0.9% IV SOLUTION: Freq: Once | INTRAVENOUS | Status: DC

## 2018-09-21 MED ORDER — ACETAMINOPHEN 325 MG PO TABS
650.0000 mg | ORAL_TABLET | ORAL | Status: DC | PRN
  Filled 2018-09-21: qty 2

## 2018-09-21 MED ORDER — ONDANSETRON HCL 4 MG PO TABS: 4.0000 mg | ORAL_TABLET | Freq: Four times a day (QID) | ORAL | Status: DC | PRN

## 2018-09-21 MED ORDER — ONDANSETRON HCL 4 MG/2ML IJ SOLN: 4.0000 mg | Freq: Four times a day (QID) | INTRAMUSCULAR | Status: DC | PRN

## 2018-09-21 MED ORDER — LACTATED RINGERS IV SOLN
INTRAVENOUS | Status: DC
  Administered 2018-09-21 – 2018-09-22 (×4): via INTRAVENOUS

## 2018-09-21 NOTE — MAU Note (Signed)
Pt reports she was sent from office. Told the pregnancy is not in uterus. Having som vaginal bleeding and pain.

## 2018-09-21 NOTE — Assessment & Plan Note (Signed)
Same and continues.

## 2018-09-21 NOTE — Assessment & Plan Note (Signed)
?   By u/s-->agrees to admission w/ Mri for further eval and knows she might need surgical treatment. Advised of NPO and go to [facility] admitting. Report called to [facility].

## 2018-09-21 NOTE — H&P (Signed)
Obstetrics & Gynecology H&P   Date of Admission: 09/21/2018   Primary OBGYN: None Primary Care Provider: Patient, No Pcp Per  Reason for Admission: ?cornual ectopic  History of Present Illness: Ms. Fleet Contrasbbood is a 28 y.o. G2P1001 (Patient's last menstrual period was 07/26/2018.), with the above CC. PMHx is significant for nothing.  Patient prefers female providers but okay with me for interview.   Patient initially seen on 6/14 in the MAU for VB. HCG was 7878 and u/s showed a YS and GS with MSD c/w 5/3 weeks at 7.686mm. No fetal pole seen. Patient seen in the MAU the next day for more VB and u/s was unchanged except for seeing a moderate subchorionic hemorrhage. Patient seen on 7/2 for VB and HCG was [id number] with u/s showing MSD of 10.88 c/w 5/5wks with subchorionic hemorrhage described as "large". Plan at that time was to keep her 7/13 u/s appt.  Today, u/s results below  No VB but states has been bleeding for the past month and has about a pad or two per day of bleeding. Patient with mild abdominal discomfort currently.   ROS: A 12-point review of systems was performed and negative, except as stated in the above HPI.  OBGYN History: As per HPI. OB History  Gravida Para Term Preterm AB Living  2 1 1     1   SAB TAB Ectopic Multiple Live Births          1    # Outcome Date GA Lbr Len/2nd Weight Sex Delivery Anes PTL Lv  2 Current           1 Term 2016     Vag-Spont   LIV    Periods: qmonth, regular Patient states this pregnancy was naturally conceived. She denies any GYN surgeries.    Past Medical History: Past Medical History:  Diagnosis Date  . Medical history non-contributory     Past Surgical History: Past Surgical History:  Procedure Laterality Date  . NO PAST SURGERIES      Family History:  History reviewed. No pertinent family history.  Social History:  Social History   Socioeconomic History  . Marital status: Married    Spouse name: Not on file  . Number of  children: Not on file  . Years of education: Not on file  . Highest education level: Not on file  Occupational History  . Not on file  Social Needs  . Financial resource strain: Not on file  . Food insecurity    Worry: Not on file    Inability: Not on file  . Transportation needs    Medical: Not on file    Non-medical: Not on file  Tobacco Use  . Smoking status: Never Smoker  . Smokeless tobacco: Never Used  Substance and Sexual Activity  . Alcohol use: Not Currently  . Drug use: Not Currently  . Sexual activity: Not Currently    Comment: had sex 3 weeks ago  Lifestyle  . Physical activity    Days per week: Not on file    Minutes per session: Not on file  . Stress: Not on file  Relationships  . Social Musicianconnections    Talks on phone: Not on file    Gets together: Not on file    Attends religious service: Not on file    Active member of club or organization: Not on file    Attends meetings of clubs or organizations: Not on file    Relationship status: Not on  file  . Intimate partner violence    Fear of current or ex partner: Not on file    Emotionally abused: Not on file    Physically abused: Not on file    Forced sexual activity: Not on file  Other Topics Concern  . Not on file  Social History Narrative  . Not on file    Allergy: No Known Allergies  Current Outpatient Medications: Medications Prior to Admission  Medication Sig Dispense Refill Last Dose  . doxylamine, Sleep, (UNISOM) 25 MG tablet Take 1 tablet (25 mg total) by mouth every 8 (eight) hours as needed. 30 tablet 0   . Prenatal MV & Min w/FA-DHA (ONE A DAY PRENATAL PO) Take by mouth.     . pyridOXINE (VITAMIN B-6) 25 MG tablet Take 1 tablet (25 mg total) by mouth every 8 (eight) hours. 30 tablet 0      Hospital Medications: No current facility-administered medications for this encounter.      Physical Exam:  Current Vital Signs 24h Vital Sign Ranges  T 97.6 F (36.4 C) Temp  Avg: 97.6 F (36.4  C)  Min: 97.6 F (36.4 C)  Max: 97.6 F (36.4 C)  BP 115/65 BP  Min: 115/65  Max: 115/65  HR 93 Pulse  Avg: 93  Min: 93  Max: 93  RR 18 Resp  Avg: 18  Min: 18  Max: 18  SaO2     No data recorded       24 Hour I/O Current Shift I/O  Time Ins Outs No intake/output data recorded. No intake/output data recorded.   Patient Vitals for the past 24 hrs:  BP Temp Pulse Resp  09/21/18 1529 115/65 97.6 F (36.4 C) 93 18    There is no height or weight on file to calculate BMI. General appearance: Well nourished, well developed female in no acute distress.  Patient declines exam  Laboratory: No new labs. Pt is Rh pos  Imaging:  CLINICAL DATA:  Vaginal bleeding in pregnancy. First trimester. Patient was 5 weeks 5 days pregnant based on her ultrasound dated 09/10/2018.  EXAM: TRANSVAGINAL OB ULTRASOUND  TECHNIQUE: Transvaginal ultrasound was performed for complete evaluation of the gestation as well as the maternal uterus, adnexal regions, and pelvic cul-de-sac.  COMPARISON:  09/10/2018.  FINDINGS: Intrauterine gestational sac: Single  Yolk sac:  Not Visualized.  Embryo:  Not Visualized.  Cardiac Activity: Not Visualized.  Heart Rate: 174 bpm  CRL:   17.5 mm   8 w 1 d                  [place] EDC: 05/02/2019  Subchorionic hemorrhage: Large subchronic hemorrhage measuring approximately 8.1 x 5.6 x 6.2 cm.  Maternal uterus/adnexae: The gestational sac lies in the right upper uterus, suspicious for a cornual pregnancy. This is supported by a thin surrounding myometrium.  No uterine masses.  Right ovarian corpus luteum. Ovaries normal in overall size. No adnexal masses.  Small amount of pelvic free fluid containing internal echoes.  IMPRESSION: 1. Single live intrauterine pregnancy with a measured gestational age of [redacted] weeks 1 day. 2. The position of the gestational sac is concerning for a right cornual pregnancy. There is a large subchorionic  hemorrhage and a small amount of pelvic free fluid containing internal echoes, possibly hemorrhage. Critical Value/emergent results were called by telephone at the time of interpretation on 09/21/2018 at 1:48 pm to obstetrician on-call, who verbally acknowledged these results.  Electronically Signed: By: Shanon Brow  Ormond M.D. On: 09/21/2018 13:49  Error in the above report. The yolk sac, embryo and cardiac activity should be reported as visualized, instead of not visualized. There was a definitive yolk sac, well seen embryo and positive cardiac activity.  Assessment: Ms. Fleet Contrasbbood is a 28 y.o. G2P1001 (Patient's last menstrual period was 07/26/2018.) with possible cornual ectopic, threatened AB. Pt stable  Plan: *Admit to GYN. Follow up admit labs. D/w her recommend MRI pelvis to see exactly where pregnancy is. It is concerning that she had a quant of 34k and no viable IUP seen and her MSD only rose slightly in her prior u/s which makes it concerning for abnormal pregnancy whether intrauterine or as an ectopic. Patient amenable to transfusion PRN. Two units ordered for crossmatch *FEN/GI: MIVF, NPO *PPx: SCDs *Pain: no current needs *Dispo: pending MRI  Interpreter used   Total time taking care of the patient was 20 minutes, with greater than 50% of the time spent in face to face interaction with the patient.  Cornelia Copaharlie Markcus Lazenby, Jr MD Attending [facility] GYN Consult Phone: [phone] (M-F, 0800-1700) & [phone] (Off hours, weekends, holidays)

## 2018-09-21 NOTE — Progress Notes (Signed)
   Subjective:    Patient ID: Dawn Bullock is a 28 y.o. female presenting with No chief complaint on file.  on 09/21/2018  HPI: Here today for f/u u/s. Today's u/s shows large subchorionic hemorrhage and possible right cornual ectopic. She denies pain, but reports heavy vaginal bleeding. Per radiology she should consider MRI for further eval of her pregnancy location.  Review of Systems  Constitutional: Negative for chills and fever.  Respiratory: Negative for shortness of breath.   Cardiovascular: Negative for chest pain.  Gastrointestinal: Negative for abdominal pain, nausea and vomiting.  Genitourinary: Positive for vaginal bleeding. Negative for dysuria.  Skin: Negative for rash.      Objective:    LMP 07/26/2018  Physical Exam Constitutional:      General: She is not in acute distress.    Appearance: She is well-developed.  HENT:     Head: Normocephalic and atraumatic.  Eyes:     General: No scleral icterus. Neck:     Musculoskeletal: Neck supple.  Cardiovascular:     Rate and Rhythm: Normal rate.  Pulmonary:     Effort: Pulmonary effort is normal.  Abdominal:     Palpations: Abdomen is soft.  Skin:    General: Skin is warm and dry.  Neurological:     Mental Status: She is alert and oriented to person, place, and time.         Assessment & Plan:   Problem List Items Addressed This Visit      Unprioritized   Subchorionic hemorrhage in first trimester    Same and continues.      Pregnancy, ectopic, cornual or cervical    ? By u/s-->agrees to admission w/ Mri for further eval and knows she might need surgical treatment. Advised of NPO and go to [facility] admitting. Report called to [facility].         Total face-to-face time with patient: 15 minutes. Over 50% of encounter was spent on counseling and coordination of care. Return if symptoms worsen or fail to improve.  Dawn Bullock 09/21/2018 3:17 PM

## 2018-09-22 ENCOUNTER — Observation Stay (HOSPITAL_COMMUNITY): Payer: Medicaid Other | Admitting: Certified Registered Nurse Anesthetist

## 2018-09-22 ENCOUNTER — Encounter (HOSPITAL_COMMUNITY): Payer: Self-pay | Admitting: Orthopedic Surgery

## 2018-09-22 ENCOUNTER — Encounter (HOSPITAL_COMMUNITY)
Admission: AD | Disposition: A | Discharge status: Home / Self Care | Payer: Self-pay | Source: Home / Self Care | Attending: Obstetrics and Gynecology

## 2018-09-22 ENCOUNTER — Observation Stay (HOSPITAL_COMMUNITY): Payer: Medicaid Other

## 2018-09-22 DIAGNOSIS — N7011 Chronic salpingitis: Secondary | ICD-10-CM | POA: Diagnosis not present

## 2018-09-22 DIAGNOSIS — O008 Other ectopic pregnancy without intrauterine pregnancy: Secondary | ICD-10-CM | POA: Diagnosis not present

## 2018-09-22 DIAGNOSIS — Z9889 Other specified postprocedural states: Secondary | ICD-10-CM

## 2018-09-22 DIAGNOSIS — Z3A08 8 weeks gestation of pregnancy: Secondary | ICD-10-CM | POA: Diagnosis not present

## 2018-09-22 DIAGNOSIS — O009 Unspecified ectopic pregnancy without intrauterine pregnancy: Secondary | ICD-10-CM | POA: Diagnosis not present

## 2018-09-22 DIAGNOSIS — Z98891 History of uterine scar from previous surgery: Secondary | ICD-10-CM

## 2018-09-22 DIAGNOSIS — Z1159 Encounter for screening for other viral diseases: Secondary | ICD-10-CM | POA: Diagnosis not present

## 2018-09-22 DIAGNOSIS — N858 Other specified noninflammatory disorders of uterus: Secondary | ICD-10-CM | POA: Diagnosis not present

## 2018-09-22 DIAGNOSIS — O080 Genital tract and pelvic infection following ectopic and molar pregnancy: Secondary | ICD-10-CM | POA: Diagnosis not present

## 2018-09-22 HISTORY — PX: EXPLORATORY LAPAROTOMY: SUR591

## 2018-09-22 HISTORY — PX: LAPAROTOMY: SHX154

## 2018-09-22 HISTORY — PX: UNILATERAL SALPINGECTOMY: SHX6160

## 2018-09-22 LAB — COMPREHENSIVE METABOLIC PANEL
ALT: 12 U/L (ref 0–44)
AST: 18 U/L (ref 15–41)
Albumin: 3.4 g/dL — ABNORMAL LOW (ref 3.5–5.0)
Alkaline Phosphatase: 35 U/L — ABNORMAL LOW (ref 38–126)
Anion gap: 10 (ref 5–15)
BUN: <5 mg/dL — ABNORMAL LOW (ref 6–20)
CO2: 22 mmol/L (ref 22–32)
Calcium: 8.7 mg/dL — ABNORMAL LOW (ref 8.9–10.3)
Chloride: 103 mmol/L (ref 98–111)
Creatinine, Ser: 0.6 mg/dL (ref 0.44–1.00)
GFR calc Af Amer: >60 mL/min (ref >60)
GFR calc non Af Amer: >60 mL/min (ref >60)
Glucose, Bld: 135 mg/dL — ABNORMAL HIGH (ref 70–99)
Potassium: 3.6 mmol/L (ref 3.5–5.1)
Sodium: 135 mmol/L (ref 135–145)
Total Bilirubin: 0.7 mg/dL (ref 0.3–1.2)
Total Protein: 6 g/dL — ABNORMAL LOW (ref 6.5–8.1)

## 2018-09-22 LAB — CBC
HCT: 28.4 % — ABNORMAL LOW (ref 36.0–46.0)
Hemoglobin: 10 g/dL — ABNORMAL LOW (ref 12.0–15.0)
MCH: 30.5 pg (ref 26.0–34.0)
MCHC: 35.2 g/dL (ref 30.0–36.0)
MCV: 86.6 fL (ref 80.0–100.0)
Platelets: 200 10*3/uL (ref 150–400)
RBC: 3.28 MIL/uL — ABNORMAL LOW (ref 3.87–5.11)
RDW: 11.6 % (ref 11.5–15.5)
WBC: 13.7 10*3/uL — ABNORMAL HIGH (ref 4.0–10.5)
nRBC: 0 % (ref 0.0–0.2)

## 2018-09-22 LAB — SURGICAL PCR SCREEN
MRSA, PCR: NEGATIVE
Staphylococcus aureus: NEGATIVE

## 2018-09-22 LAB — SARS CORONAVIRUS 2 BY RT PCR (HOSPITAL ORDER, PERFORMED IN ~~LOC~~ HOSPITAL LAB): SARS Coronavirus 2: NEGATIVE

## 2018-09-22 SURGERY — LAPAROTOMY, EXPLORATORY
Anesthesia: General | Site: Abdomen | Laterality: Right

## 2018-09-22 MED ORDER — OXYCODONE HCL 5 MG PO TABS
ORAL_TABLET | ORAL | Status: AC
  Filled 2018-09-22: qty 1

## 2018-09-22 MED ORDER — HYDROMORPHONE HCL 1 MG/ML IJ SOLN
1.0000 mg | INTRAMUSCULAR | Status: AC | PRN
  Administered 2018-09-22 (×2): 1 mg via INTRAVENOUS
  Filled 2018-09-22 (×2): qty 1

## 2018-09-22 MED ORDER — MIDAZOLAM HCL 2 MG/2ML IJ SOLN
INTRAMUSCULAR | Status: DC | PRN
  Administered 2018-09-22: 2 mg via INTRAVENOUS

## 2018-09-22 MED ORDER — SUGAMMADEX SODIUM 200 MG/2ML IV SOLN
INTRAVENOUS | Status: DC | PRN
  Administered 2018-09-22: 150 mg via INTRAVENOUS

## 2018-09-22 MED ORDER — LACTATED RINGERS IV SOLN
INTRAVENOUS | Status: DC
  Administered 2018-09-22: 09:00:00 via INTRAVENOUS

## 2018-09-22 MED ORDER — DEXAMETHASONE SODIUM PHOSPHATE 10 MG/ML IJ SOLN
INTRAMUSCULAR | Status: DC | PRN
  Administered 2018-09-22: 4 mg via INTRAVENOUS

## 2018-09-22 MED ORDER — HYDROMORPHONE HCL 1 MG/ML IJ SOLN
INTRAMUSCULAR | Status: DC | PRN
  Administered 2018-09-22: 0.5 mg via INTRAVENOUS

## 2018-09-22 MED ORDER — PROPOFOL 10 MG/ML IV BOLUS
INTRAVENOUS | Status: DC | PRN
  Administered 2018-09-22: 170 mg via INTRAVENOUS

## 2018-09-22 MED ORDER — ONDANSETRON HCL 4 MG/2ML IJ SOLN
4.0000 mg | Freq: Four times a day (QID) | INTRAMUSCULAR | Status: DC | PRN
  Filled 2018-09-22: qty 2

## 2018-09-22 MED ORDER — FENTANYL CITRATE (PF) 100 MCG/2ML IJ SOLN
INTRAMUSCULAR | Status: DC | PRN
  Administered 2018-09-22: 50 ug via INTRAVENOUS
  Administered 2018-09-22: 100 ug via INTRAVENOUS

## 2018-09-22 MED ORDER — LIDOCAINE 2% (20 MG/ML) 5 ML SYRINGE
INTRAMUSCULAR | Status: DC | PRN
  Administered 2018-09-22: 60 mg via INTRAVENOUS

## 2018-09-22 MED ORDER — MIDAZOLAM HCL 2 MG/2ML IJ SOLN
INTRAMUSCULAR | Status: AC
  Filled 2018-09-22: qty 2

## 2018-09-22 MED ORDER — 0.9 % SODIUM CHLORIDE (POUR BTL) OPTIME
TOPICAL | Status: DC | PRN
  Administered 2018-09-22: 2000 mL

## 2018-09-22 MED ORDER — FENTANYL CITRATE (PF) 100 MCG/2ML IJ SOLN
INTRAMUSCULAR | Status: AC
  Filled 2018-09-22: qty 2

## 2018-09-22 MED ORDER — LABETALOL HCL 5 MG/ML IV SOLN
INTRAVENOUS | Status: AC
  Filled 2018-09-22: qty 4

## 2018-09-22 MED ORDER — PROPOFOL 10 MG/ML IV BOLUS
INTRAVENOUS | Status: AC
  Filled 2018-09-22: qty 20

## 2018-09-22 MED ORDER — HYDRALAZINE HCL 20 MG/ML IJ SOLN
INTRAMUSCULAR | Status: AC
  Filled 2018-09-22: qty 1

## 2018-09-22 MED ORDER — ONDANSETRON HCL 4 MG/2ML IJ SOLN
4.0000 mg | Freq: Once | INTRAMUSCULAR | Status: AC | PRN
  Administered 2018-09-22: 11:00:00 4 mg via INTRAVENOUS

## 2018-09-22 MED ORDER — ONDANSETRON HCL 4 MG/2ML IJ SOLN
INTRAMUSCULAR | Status: DC | PRN
  Administered 2018-09-22: 4 mg via INTRAVENOUS

## 2018-09-22 MED ORDER — SIMETHICONE 80 MG PO CHEW
80.0000 mg | CHEWABLE_TABLET | Freq: Three times a day (TID) | ORAL | Status: DC
  Administered 2018-09-22 – 2018-09-24 (×5): 80 mg via ORAL
  Filled 2018-09-22 (×5): qty 1

## 2018-09-22 MED ORDER — BUPIVACAINE-EPINEPHRINE (PF) 0.5% -1:200000 IJ SOLN
INTRAMUSCULAR | Status: AC
  Filled 2018-09-22: qty 30

## 2018-09-22 MED ORDER — SUCCINYLCHOLINE CHLORIDE 200 MG/10ML IV SOSY
PREFILLED_SYRINGE | INTRAVENOUS | Status: DC | PRN
  Administered 2018-09-22: 70 mg via INTRAVENOUS

## 2018-09-22 MED ORDER — LACTATED RINGERS IV BOLUS
500.0000 mL | Freq: Once | INTRAVENOUS | Status: AC
  Administered 2018-09-22: 500 mL via INTRAVENOUS

## 2018-09-22 MED ORDER — EPHEDRINE SULFATE-NACL 50-0.9 MG/10ML-% IV SOSY
PREFILLED_SYRINGE | INTRAVENOUS | Status: DC | PRN
  Administered 2018-09-22: 10 mg via INTRAVENOUS
  Administered 2018-09-22 (×2): 5 mg via INTRAVENOUS

## 2018-09-22 MED ORDER — OXYCODONE HCL 5 MG/5ML PO SOLN: 5.0000 mg | Freq: Once | ORAL | Status: AC | PRN

## 2018-09-22 MED ORDER — ACETAMINOPHEN 325 MG PO TABS
650.0000 mg | ORAL_TABLET | ORAL | Status: DC
  Administered 2018-09-22 – 2018-09-24 (×9): 650 mg via ORAL
  Filled 2018-09-22 (×10): qty 2

## 2018-09-22 MED ORDER — ONDANSETRON HCL 4 MG/2ML IJ SOLN
INTRAMUSCULAR | Status: AC
  Administered 2018-09-22: 4 mg via INTRAVENOUS
  Filled 2018-09-22: qty 2

## 2018-09-22 MED ORDER — IBUPROFEN 800 MG PO TABS
800.0000 mg | ORAL_TABLET | Freq: Three times a day (TID) | ORAL | Status: DC
  Administered 2018-09-22 – 2018-09-24 (×4): 800 mg via ORAL
  Filled 2018-09-22 (×5): qty 1

## 2018-09-22 MED ORDER — PANTOPRAZOLE SODIUM 40 MG PO TBEC
40.0000 mg | DELAYED_RELEASE_TABLET | Freq: Every day | ORAL | Status: DC
  Administered 2018-09-22 – 2018-09-23 (×2): 40 mg via ORAL
  Filled 2018-09-22 (×2): qty 1

## 2018-09-22 MED ORDER — FENTANYL CITRATE (PF) 250 MCG/5ML IJ SOLN
INTRAMUSCULAR | Status: AC
  Filled 2018-09-22: qty 5

## 2018-09-22 MED ORDER — OXYCODONE HCL 5 MG PO TABS
5.0000 mg | ORAL_TABLET | Freq: Once | ORAL | Status: AC | PRN
  Administered 2018-09-22: 5 mg via ORAL

## 2018-09-22 MED ORDER — ONDANSETRON HCL 4 MG PO TABS: 4.0000 mg | ORAL_TABLET | Freq: Four times a day (QID) | ORAL | Status: DC | PRN

## 2018-09-22 MED ORDER — LACTATED RINGERS IV SOLN
INTRAVENOUS | Status: DC
  Administered 2018-09-22: 18:00:00 via INTRAVENOUS

## 2018-09-22 MED ORDER — POLYETHYLENE GLYCOL 3350 17 G PO PACK
17.0000 g | PACK | Freq: Every day | ORAL | Status: DC
  Administered 2018-09-22 – 2018-09-23 (×2): 17 g via ORAL
  Filled 2018-09-22 (×2): qty 1

## 2018-09-22 MED ORDER — BUPIVACAINE HCL (PF) 0.5 % IJ SOLN
INTRAMUSCULAR | Status: AC
  Filled 2018-09-22: qty 30

## 2018-09-22 MED ORDER — HYDROMORPHONE HCL 1 MG/ML IJ SOLN
INTRAMUSCULAR | Status: AC
  Filled 2018-09-22: qty 0.5

## 2018-09-22 MED ORDER — OXYCODONE HCL 5 MG PO TABS
5.0000 mg | ORAL_TABLET | ORAL | Status: DC | PRN
  Administered 2018-09-23: 5 mg via ORAL
  Filled 2018-09-22: qty 1

## 2018-09-22 MED ORDER — FENTANYL CITRATE (PF) 100 MCG/2ML IJ SOLN
25.0000 ug | INTRAMUSCULAR | Status: DC | PRN
  Administered 2018-09-22: 25 ug via INTRAVENOUS

## 2018-09-22 MED ORDER — ROCURONIUM BROMIDE 10 MG/ML (PF) SYRINGE
PREFILLED_SYRINGE | INTRAVENOUS | Status: DC | PRN
  Administered 2018-09-22: 10 mg via INTRAVENOUS
  Administered 2018-09-22: 50 mg via INTRAVENOUS

## 2018-09-22 SURGICAL SUPPLY — 45 items
BARRIER ADHS 3X4 INTERCEED (GAUZE/BANDAGES/DRESSINGS) ×2 IMPLANT
BENZOIN TINCTURE PRP APPL 2/3 (GAUZE/BANDAGES/DRESSINGS) ×2 IMPLANT
CANISTER SUCT 3000ML PPV (MISCELLANEOUS) ×2 IMPLANT
CATH ROBINSON RED A/P 14FR (CATHETERS) ×2 IMPLANT
CELLS DAT CNTRL 66122 CELL SVR (MISCELLANEOUS) ×3 IMPLANT
CLOSURE WOUND 1/2 X4 (GAUZE/BANDAGES/DRESSINGS) ×1
DECANTER SPIKE VIAL GLASS SM (MISCELLANEOUS) IMPLANT
DRAPE WARM FLUID 44X44 (DRAPES) ×2 IMPLANT
DRSG OPSITE POSTOP 4X10 (GAUZE/BANDAGES/DRESSINGS) IMPLANT
DURAPREP 26ML APPLICATOR (WOUND CARE) ×2 IMPLANT
GAUZE 4X4 16PLY RFD (DISPOSABLE) ×4 IMPLANT
GLOVE BIO SURGEON STRL SZ7 (GLOVE) ×2 IMPLANT
GLOVE BIOGEL PI IND STRL 7.0 (GLOVE) IMPLANT
GLOVE BIOGEL PI IND STRL 7.5 (GLOVE) IMPLANT
GLOVE BIOGEL PI INDICATOR 7.0 (GLOVE) ×4
GLOVE BIOGEL PI INDICATOR 7.5 (GLOVE) ×2
GOWN STRL REUS W/ TWL LRG LVL3 (GOWN DISPOSABLE) IMPLANT
GOWN STRL REUS W/ TWL XL LVL3 (GOWN DISPOSABLE) IMPLANT
GOWN STRL REUS W/TWL LRG LVL3 (GOWN DISPOSABLE) ×2
GOWN STRL REUS W/TWL XL LVL3 (GOWN DISPOSABLE) ×2
HIBICLENS CHG 4% 4OZ BTL (MISCELLANEOUS) ×2 IMPLANT
KIT TURNOVER KIT B (KITS) ×2 IMPLANT
PACK ABDOMINAL GYN (CUSTOM PROCEDURE TRAY) ×2 IMPLANT
PAD ARMBOARD 7.5X6 YLW CONV (MISCELLANEOUS) ×2 IMPLANT
PAD OB MATERNITY 4.3X12.25 (PERSONAL CARE ITEMS) ×2 IMPLANT
RETRACTOR WND ALEXIS 18 MED (MISCELLANEOUS) IMPLANT
RTRCTR WOUND ALEXIS 18CM MED (MISCELLANEOUS) ×5
SPECIMEN JAR MEDIUM (MISCELLANEOUS) ×2 IMPLANT
SPONGE LAP 18X18 RF (DISPOSABLE) IMPLANT
STAPLER VISISTAT 35W (STAPLE) ×2 IMPLANT
STRIP CLOSURE SKIN 1/2X4 (GAUZE/BANDAGES/DRESSINGS) ×1 IMPLANT
SUT MON AB 4-0 PS1 27 (SUTURE) ×4 IMPLANT
SUT PDS AB 0 CTX 60 (SUTURE) IMPLANT
SUT PLAIN 2 0 XLH (SUTURE) ×2 IMPLANT
SUT VIC AB 0 CT1 36 (SUTURE) ×4 IMPLANT
SUT VIC AB 1 CT1 18XBRD ANBCTR (SUTURE) IMPLANT
SUT VIC AB 1 CT1 8-18 (SUTURE) ×4
SUT VIC AB 1 CTB1 27 (SUTURE) ×6 IMPLANT
SUT VIC AB 3-0 CT1 27 (SUTURE) ×2
SUT VIC AB 3-0 CT1 TAPERPNT 27 (SUTURE) IMPLANT
SUT VIC AB 3-0 SH 27 (SUTURE) ×4
SUT VIC AB 3-0 SH 27X BRD (SUTURE) IMPLANT
SUT VIC AB 3-0 SH 27XBRD (SUTURE) IMPLANT
TOWEL GREEN STERILE FF (TOWEL DISPOSABLE) ×4 IMPLANT
TRAY FOLEY W/BAG SLVR 14FR (SET/KITS/TRAYS/PACK) ×2 IMPLANT

## 2018-09-22 NOTE — Brief Op Note (Signed)
09/22/2018  10:48 AM  PATIENT:  Dawn Bullock  28 y.o. female  PRE-OPERATIVE DIAGNOSIS:  Right cornual pregnancy  POST-OPERATIVE DIAGNOSIS:  Same. Right hydrosalpingx  PROCEDURE:  Procedure(s): Exploratory Laparotomy and Hysterotomy with removal of Ectopic Pregnancy (N/A) Unilateral Salpingectomy (Right)  SURGEON:  Surgeon(s) and Role:    * Aletha Halim, MD - Primary  PHYSICIAN ASSISTANT: None  ASSISTANTS: none   ANESTHESIA:   general  EBL:  150 mL   BLOOD ADMINISTERED:none  DRAINS: indwelling foley 337mL UOP   LOCAL MEDICATIONS USED:  NONE  SPECIMEN:  Right fallopian tube, cornual pregnancy  DISPOSITION OF SPECIMEN:  PATHOLOGY  COUNTS:  YES  TOURNIQUET:  * No tourniquets in log *  DICTATION: .Note written in paper chart  PLAN OF CARE: Admit to inpatient   PATIENT DISPOSITION:  PACU - hemodynamically stable.   Delay start of Pharmacological VTE agent (>24hrs) due to surgical blood loss or risk of bleeding: yes  Durene Romans MD Attending [facility] for [place] [facility]) GYN Consult Phone: [phone] (M-F, [phone]) & [phone] (Off hours, weekends, holidays)

## 2018-09-22 NOTE — Plan of Care (Signed)
  Problem: Education: Goal: Knowledge of General Education information will improve Description Including pain rating scale, medication(s)/side effects and non-pharmacologic comfort measures Outcome: Progressing   Problem: Health Behavior/Discharge Planning: Goal: Ability to manage health-related needs will improve Outcome: Progressing   Problem: Clinical Measurements: Goal: Ability to maintain clinical measurements within normal limits will improve Outcome: Progressing Goal: Will remain free from infection Outcome: Progressing Goal: Diagnostic test results will improve Outcome: Progressing   Problem: Activity: Goal: Risk for activity intolerance will decrease Outcome: Progressing   Problem: Coping: Goal: Level of anxiety will decrease Outcome: Progressing   Problem: Elimination: Goal: Will not experience complications related to urinary retention Outcome: Progressing   Problem: Pain Managment: Goal: General experience of comfort will improve Outcome: Progressing   Problem: Safety: Goal: Ability to remain free from injury will improve Outcome: Progressing   Problem: Skin Integrity: Goal: Risk for impaired skin integrity will decrease Outcome: Progressing   

## 2018-09-22 NOTE — Op Note (Addendum)
Operative Note   09/22/2018  PRE-OP DIAGNOSIS *Ectopic Pregnancy    POST-OP DIAGNOSIS *Right interstitial pregnancy *Right hydrosalpinx    SURGEON: Surgeon(s) and Role:    Aletha Halim, MD - Primary  ASSISTANT: None  PROCEDURE: exploratory laparotomy, hysterotomy, evacuation of pregnancy, right salpingectomy  ANESTHESIA: General and local  ESTIMATED BLOOD LOSS: 1107mL  DRAINS: indwelling foley 334mL UOP    TOTAL IV FLUIDS: 1960mL crystalloid  VTE PROPHYLAXIS: SCDs to the bilateral lower extremities  ANTIBIOTICS: not indicated  SPECIMENS: evacuated pregnancy, right fallopian tube  DISPOSITION: PACU - hemodynamically stable.  CONDITION: stable  COMPLICATIONS: None  FINDINGS: right hydrosalpinx at the cornual area/isthmus area, with distension at the right fundal area extending into the cornua; this area was  distended and hypervascular area. On hysterotomy, embryo, products of conception and blood clot (approximately 211mL) removed from the uterus.   Normal left fallopian tube, normal bilateral ovaries, otherwise normal uterus. No intra-abdominal adhesions.   DESCRIPTION OF PROCEDURE: After informed consent was obtained, the patient was prepped and draped in the usual sterile manner for an abdominal procedure and placed in the dorsal supine position.  A time was done, and a pfannenstiel skin incision was made into the abdomen.  Once inside the abdominal cavity, a small alexis retractor was placed to expose the pelvic cavity with 3 lap sponges, with the above noted findings.  The uterus was then identified and the a window created in the bilateral broad ligaments. Next, a red rubber catheter was placed through this and gradually tightened, in order to compress the uterine arteries.  Two kelly clamps were used to clamp the right utero-ovarian ligament, due to the abnormal tube and to decrease blood supply to the operative area. This area was tied and then suture ligated with  a 0-vicryl at the ovarian-tube pedicle and then suture ligated at the uterus. On cut current, a 4cm vertical hysterotomy was made just medial the the hypervascularity and the products of conception and blood clot delivered.  The uterine cavity was then cleaned with a lap sponge and gentle curettage.  The breech in the endometrium was approximately 1.5cm and  was closed with 3-0 vicryl.  The myometrium was closed with 1-0 vicryl and the serosa with 3-0 vicryl; a layer of interceed was placed over the incision. The red rubber was removed and the area was hemostatic. Next, the right mesosalpingx was clamped and cut with a kelly clamp, cut and suture ligated with 1-0 vicryl to remove the right tube  The lap sponges were then removed and the self-retaining retractor was removed.The fascia was closed with #0 Vicryl in a running continuous manner and the subcutaneous tissue was also closed with #2-0 plain gut and the skin was closed with 4-0 monocryl. Hemostasis was secured throughout the entire layers. Instrument counts were correct x 2 and the patient was taken to the recovery room in good condition and breathing independently.  Using an interpreter, I discussed this with her husband and I will tell the patient post operatively but I recommend she treat this hysterotomy like a classical incision and will need c-sections for any subsequent pregnancies at 86 weeks.   Durene Romans MD Attending Center for [employer] [facility] [employer] farm [facility])

## 2018-09-22 NOTE — Anesthesia Procedure Notes (Signed)
Procedure Name: Intubation Date/Time: 09/22/2018 9:18 AM Performed by: Leonor Liv, CRNA Pre-anesthesia Checklist: Patient identified, Emergency Drugs available, Suction available and Patient being monitored Patient Re-evaluated:Patient Re-evaluated prior to induction Oxygen Delivery Method: Circle System Utilized Preoxygenation: Pre-oxygenation with 100% oxygen Induction Type: IV induction Ventilation: Mask ventilation without difficulty Laryngoscope Size: Mac and 3 Grade View: Grade I Tube type: Oral Tube size: 7.0 mm Number of attempts: 1 Airway Equipment and Method: Stylet and Oral airway Placement Confirmation: ETT inserted through vocal cords under direct vision,  positive ETCO2 and breath sounds checked- equal and bilateral Secured at: 21 cm Tube secured with: Tape Dental Injury: Teeth and Oropharynx as per pre-operative assessment

## 2018-09-22 NOTE — Progress Notes (Signed)
Gynecology Post Op Check Note  Admission Date: 09/21/2018 Current Date: 09/22/2018 2:09 PM  Dawn Bullock is a 28 y.o. G2P1011 POD#0/ex-lap removal of interstitial pregnancy/right salpingectomy/HD#2 admitted for presumed cornual ectopic pregnancy   History complicated by: Patient Active Problem List   Diagnosis Date Noted  . Status post surgery 09/22/2018  . Pregnancy, ectopic, cornual or cervical 09/21/2018  . Threatened abortion 09/21/2018  . Cornual pregnancy 09/21/2018  . Language barrier, cultural differences 09/04/2018  . Bleeding in early pregnancy 09/04/2018  . Subchorionic hemorrhage in first trimester 09/04/2018    Subjective:  Incisional pain, take some PO liquids, no nausea/vomiting  Objective:    Current Vital Signs 24h Vital Sign Ranges  T (!) 97.3 F (36.3 C) Temp  Avg: 97.5 F (36.4 C)  Min: 97.1 F (36.2 C)  Max: 98.3 F (36.8 C)  BP (!) 110/53 BP  Min: 106/64  Max: 142/129  HR 75 Pulse  Avg: 83.1  Min: 63  Max: 100  RR (!) 22 Resp  Avg: 17.7  Min: 14  Max: 23  SaO2 100 % Room Air SpO2  Avg: 99.8 %  Min: 98 %  Max: 100 %       24 Hour I/O Current Shift I/O  Time Ins Outs 07/13 0701 - 07/14 0700 In: 1016.5 [I.V.:1016.5] Out: -  07/14 0701 - 07/14 1900 In: 1900 [I.V.:1900] Out: 500 [Urine:350]   Physical exam: General appearance: NAD, tired Abdomen: soft, +BS, approp ttp, non distended. C/d/i honeycomb dressing GU: clear UOP, approximately 140mL in bag Lungs: clear to auscultation bilaterally Heart: S1, S2 normal, no murmur, rub or gallop, regular rate and rhythm Skin: warm and dry Psych: appropriate Neurologic: Grossly normal  Medications Current Facility-Administered Medications  Medication Dose Route Frequency Provider Last Rate Last Dose  . acetaminophen (TYLENOL) tablet 650 mg  650 mg Oral Q4H Aletha Halim, MD   650 mg at 09/22/18 1237  . HYDROmorphone (DILAUDID) injection 1 mg  1 mg Intravenous Q2H PRN Aletha Halim, MD       . ibuprofen (ADVIL) tablet 800 mg  800 mg Oral Q8H Aletha Halim, MD      . lactated ringers infusion   Intravenous Continuous Aletha Halim, MD      . ondansetron (ZOFRAN) tablet 4 mg  4 mg Oral Q6H PRN Aletha Halim, MD       Or  . ondansetron (ZOFRAN) injection 4 mg  4 mg Intravenous Q6H PRN Aletha Halim, MD      . oxyCODONE (Oxy IR/ROXICODONE) immediate release tablet 5-10 mg  5-10 mg Oral Q4H PRN Aletha Halim, MD      . pantoprazole (PROTONIX) EC tablet 40 mg  40 mg Oral q1800 Aletha Halim, MD      . polyethylene glycol (MIRALAX / GLYCOLAX) packet 17 g  17 g Oral M1962 Aletha Halim, MD      . simethicone (MYLICON) chewable tablet 80 mg  80 mg Oral TID AC Aletha Halim, MD         Labs  No new labs  Radiology No new imaging  Assessment & Plan:  Pt stable *GYN: routine post op care. D/c foley. Continue with PO PRNs *FEN/GI: MIVF, regular diet *Dispo: hopefully home tomorrow  Interpreter used  Code Status: Full Code  Durene Romans MD Attending Center for [place] [facility] GYN Consult Phone: [phone] (M-F, 0800-1700) & [phone] (Off hours, weekends, holidays)

## 2018-09-22 NOTE — Transfer of Care (Signed)
Immediate Anesthesia Transfer of Care Note  Patient: Dawn Bullock  Procedure(s) Performed: Exploratory Laparotomy and Hysterotomy with removal of Ectopic Pregnancy (N/A Abdomen) Unilateral Salpingectomy (Right Abdomen)  Patient Location: PACU  Anesthesia Type:General  Level of Consciousness: drowsy  Airway & Oxygen Therapy: Patient Spontanous Breathing and Patient connected to nasal cannula oxygen  Post-op Assessment: Report given to RN, Post -op Vital signs reviewed and stable and Patient moving all extremities X 4  Post vital signs: Reviewed and stable  Last Vitals:  Vitals Value Taken Time  BP 142/129 09/22/18 1105  Temp    Pulse 79 09/22/18 1115  Resp 16 09/22/18 1115  SpO2 100 % 09/22/18 1115  Vitals shown include unvalidated device data.  Last Pain:  Vitals:   09/22/18 0808  TempSrc:   PainSc: (P) 0-No pain      Patients Stated Pain Goal: 0 (27/25/36 6440)  Complications: No apparent anesthesia complications

## 2018-09-22 NOTE — Anesthesia Postprocedure Evaluation (Signed)
Anesthesia Post Note  Patient: Dawn Bullock  Procedure(s) Performed: Exploratory Laparotomy and Hysterotomy with removal of Ectopic Pregnancy (N/A Abdomen) Unilateral Salpingectomy (Right Abdomen)     Patient location during evaluation: PACU Anesthesia Type: General Level of consciousness: awake and alert Pain management: pain level controlled Vital Signs Assessment: post-procedure vital signs reviewed and stable Respiratory status: spontaneous breathing, nonlabored ventilation and respiratory function stable Cardiovascular status: blood pressure returned to baseline and stable Postop Assessment: no apparent nausea or vomiting Anesthetic complications: no    Last Vitals:  Vitals:   09/22/18 1200 09/22/18 1213  BP: 117/65 (!) 110/53  Pulse: 90 75  Resp: (!) 22   Temp: (!) 36.3 C   SpO2: 100% 100%    Last Pain:  Vitals:   09/22/18 1220  TempSrc:   PainSc: Asleep                 Lidia Collum

## 2018-09-22 NOTE — Progress Notes (Signed)
Gynecology Progress Note  Admission Date: 09/21/2018 Current Date: 09/22/2018 7:38 AM  Dawn Bullock is a 28 y.o. G2P1001 HD#2 admitted for possible cornual pregnancy.   History complicated by: Patient Active Problem List   Diagnosis Date Noted  . Pregnancy, ectopic, cornual or cervical 09/21/2018  . Threatened abortion 09/21/2018  . Cornual pregnancy 09/21/2018  . Language barrier, cultural differences 09/04/2018  . Bleeding in early pregnancy 09/04/2018  . Subchorionic hemorrhage in first trimester 09/04/2018    Subjective:  Patient feeling well, was able to sleep last night after MRI done. Reports some bleeding when she goes to bathroom, notes it when she is wiping. Denies pain. Has been NPO.  Objective:   Vitals:   09/21/18 1529 09/21/18 1657 09/21/18 2038 09/22/18 0603  BP: 115/65 112/69 115/63 106/64  Pulse: 93 73 65 63  Resp: 18 17 18 18   Temp: 97.6 F (36.4 C) (!) 97.4 F (36.3 C) 98 F (36.7 C) 98.3 F (36.8 C)  TempSrc:  Oral Oral Oral  SpO2:  100% 100% 100%    No intake/output data recorded.  Intake/Output Summary (Last 24 hours) at 09/22/2018 0738 Last data filed at 09/22/2018 0600 Gross per 24 hour  Intake 1016.49 ml  Output -  Net 1016.49 ml     Physical exam: BP 106/64 (BP Location: Right Arm)   Pulse 63   Temp 98.3 F (36.8 C) (Oral)   Resp 18   LMP 07/26/2018   SpO2 100%  CONSTITUTIONAL: Well-developed, well-nourished female in no acute distress.  HENT:  Normocephalic, atraumatic, External right and left ear normal. Oropharynx is clear and moist EYES: Conjunctivae and EOM are normal. Pupils are equal, round, and reactive to light. No scleral icterus.  NECK: Normal range of motion, supple, no masses.  Normal thyroid.  SKIN: Skin is warm and dry. No rash noted. Not diaphoretic. No erythema. No pallor. NEUROLOGIC: Alert and oriented to person, place, and time. Normal reflexes, muscle tone coordination. No cranial nerve deficit  noted. PSYCHIATRIC: Normal mood and affect. Normal behavior. Normal judgment and thought content. CARDIOVASCULAR: Normal heart rate noted RESPIRATORY: Effort normal, no problems with respiration noted. ABDOMEN: Soft, no distention noted. Very mildly tender in RLQ  PELVIC: deferred MUSCULOSKELETAL: Normal range of motion. No tenderness.  No cyanosis, clubbing, or edema.    UOP: voiding spontaneously  Labs  Recent Labs  Lab 09/21/18 1650  WBC 10.8*  HGB 10.9*  HCT 31.2*  PLT 229   IMPRESSION: 1. Findings felt consistent with interstitial ectopic pregnancy involving the right cornua of the uterus. Large heterogenous collection within the right uterus, consistent with large subchorionic hemorrhage.  Critical Value/emergent results were called by telephone at the time of interpretation on 09/22/2018 at 2:00 am to Dr. Leroy LibmanKelly Dawn Bullock , who verbally acknowledged these results.   Electronically Signed   By: Jasmine PangKim  Fujinaga M.D.   On: 09/22/2018 02:00  Assessment & Plan:   Patient is 28 y.o. G2P1001 HD#2 s/p admitted for further investigation of possible cornual ectopic. She has been stable. MRI done overnight shows interstitial ectopic pregnancy involving the right cornua of the uterus. I reviewed the above with the patient, that this is not a normal pregnancy and that she is at high risk for rupture. Reviewed recommendation for surgical management, briefly discussed type of surgery and what to expect. Patient appropriately upset but in agreement with plan, wants to discuss with her husband.   Case posted for 9 am with day team Remain NPO  2 units pRBCs on hold   K. Arvilla Meres, M.D. Attending Center for [employer] [facility] farm manager)

## 2018-09-22 NOTE — Anesthesia Preprocedure Evaluation (Addendum)
Anesthesia Evaluation  Patient identified by MRN, date of birth, ID band Patient awake    Reviewed: Allergy & Precautions, NPO status , Patient's Chart, lab work & pertinent test results  History of Anesthesia Complications Negative for: history of anesthetic complications  Airway Mallampati: II  TM Distance: >3 FB Neck ROM: Full    Dental no notable dental hx.    Pulmonary neg pulmonary ROS,    Pulmonary exam normal        Cardiovascular negative cardio ROS Normal cardiovascular exam     Neuro/Psych negative neurological ROS  negative psych ROS   GI/Hepatic negative GI ROS, Neg liver ROS,   Endo/Other  negative endocrine ROS  Renal/GU negative Renal ROS  negative genitourinary   Musculoskeletal negative musculoskeletal ROS (+)   Abdominal   Peds  Hematology negative hematology ROS (+)   Anesthesia Other Findings   Reproductive/Obstetrics (+) Pregnancy (ectopic)                            Anesthesia Physical Anesthesia Plan  ASA: II and emergent  Anesthesia Plan: General   Post-op Pain Management:    Induction: Intravenous  PONV Risk Score and Plan: 4 or greater and Ondansetron, Dexamethasone, Midazolam and Treatment may vary due to age or medical condition  Airway Management Planned: Oral ETT  Additional Equipment: None  Intra-op Plan:   Post-operative Plan: Extubation in OR  Informed Consent: I have reviewed the patients History and Physical, chart, labs and discussed the procedure including the risks, benefits and alternatives for the proposed anesthesia with the patient or authorized representative who has indicated his/her understanding and acceptance.       Plan Discussed with:   Anesthesia Plan Comments:        Anesthesia Quick Evaluation

## 2018-09-22 NOTE — Progress Notes (Addendum)
Gynecology Progress Note  Admission Date: 09/21/2018 Current Date: 09/22/2018 8:22 AM  Dawn Bullock is a 28 y.o. [id number] HD#2 admitted for presumed cornual ectopic pregnancy   History complicated by: Patient Active Problem List   Diagnosis Date Noted  . Pregnancy, ectopic, cornual or cervical 09/21/2018  . Threatened abortion 09/21/2018  . Cornual pregnancy 09/21/2018  . Language barrier, cultural differences 09/04/2018  . Bleeding in early pregnancy 09/04/2018  . Subchorionic hemorrhage in first trimester 09/04/2018    ROS and patient/family/surgical history, located on admission H&P note dated 09/21/2018, have been reviewed, and there are no changes except as noted below Yesterday/Overnight Events:  MRI confirms right cornual  Subjective:  No pain, bleeding.   Objective:    Current Vital Signs 24h Vital Sign Ranges  T 98.3 F (36.8 C) Temp  Avg: 97.8 F (36.6 C)  Min: 97.4 F (36.3 C)  Max: 98.3 F (36.8 C)  BP 106/64 BP  Min: 106/64  Max: 115/63  HR 63 Pulse  Avg: 73.5  Min: 63  Max: 93  RR 18 Resp  Avg: 17.8  Min: 17  Max: 18  SaO2 100 % Room Air SpO2  Avg: 100 %  Min: 100 %  Max: 100 %       24 Hour I/O Current Shift I/O  Time Ins Outs 07/13 0701 - 07/14 0700 In: 1016.5 [I.V.:1016.5] Out: -  No intake/output data recorded.   Patient Vitals for the past 24 hrs:  BP Temp Temp src Pulse Resp SpO2  09/22/18 0603 106/64 98.3 F (36.8 C) Oral 63 18 100 %  09/21/18 2038 115/63 98 F (36.7 C) Oral 65 18 100 %  09/21/18 1657 112/69 (!) 97.4 F (36.3 C) Oral 73 17 100 %  09/21/18 1529 115/65 97.6 F (36.4 C) - 93 18 -    Physical exam: General appearance: alert, cooperative and appears stated age Abdomen: soft, non-tender; bowel sounds normal; no masses,  no organomegaly Lungs: clear to auscultation bilaterally Heart: S1, S2 normal, no murmur, rub or gallop, regular rate and rhythm Skin: warm and dry Psych: appropriate Neurologic: Grossly  normal  Medications Current Facility-Administered Medications  Medication Dose Route Frequency Provider Last Rate Last Dose  . 0.9 %  sodium chloride infusion (Manually program via Guardrails IV Fluids)   Intravenous Once [place] Dawn Bullock, Fatih Stalvey, MD   Stopped at 09/21/18 2004  . acetaminophen (TYLENOL) tablet 650 mg  650 mg Oral Q4H PRN [place] Dawn Bullock, Tulsi Crossett, MD      . lactated ringers infusion   Intravenous Continuous [place] Dawn Bullock, Terrea Bruster, MD 100 mL/hr at 09/22/18 0600    . ondansetron (ZOFRAN) tablet 4 mg  4 mg Oral Q6H PRN [place] Dawn Bullock, Ted Leonhart, MD       Or  . ondansetron (ZOFRAN) injection 4 mg  4 mg Intravenous Q6H PRN [place] Dawn Bullock, Carizma Dunsworth, MD      . zolpidem (AMBIEN) tablet 5 mg  5 mg Oral QHS PRN  Dawn Bullock, Son Barkan, MD          Labs  Recent Labs  Lab 09/21/18 1650  WBC 10.8*  HGB 10.9*  HCT 31.2*  PLT 229    Recent Labs  Lab 09/21/18 1650  NA 136  K 3.5  CL 103  CO2 22  BUN 6  CREATININE 0.57  CALCIUM 9.0  PROT 6.7  BILITOT 0.6  ALKPHOS 38  ALT 11  AST 16  GLUCOSE 91    Radiology CLINICAL DATA:  Possible ectopic pregnancy  EXAM: MRI PELVIS WITHOUT CONTRAST  TECHNIQUE: Multiplanar multisequence MR imaging of the pelvis was performed. No intravenous contrast was administered.  COMPARISON:  Ultrasound 09/21/2018  FINDINGS: Urinary Tract:  No abnormality visualized.  Bowel:  Unremarkable visualized pelvic bowel loops.  Vascular/Lymphatic: No pathologically enlarged lymph nodes. No significant vascular abnormality seen.  Reproductive: 3.2 cm ovoid T2 bright lesion within the right cornua of the uterus, corresponding to the ultrasound abnormality and felt due to abnormally positioned gestational sac/interstitial ectopic pregnancy. Gestational sac is eccentric to the junctional zone and there is abnormal increased T2 signal within the myometrium of the right cornua of the uterus. Heterogenous collection containing T1 bright areas measuring approximately 6.1 x 3.9 cm  within the right uterus and contiguous with the ectopic pregnancy, felt to correspond to the large subchorionic hemorrhage noted on ultrasound.  The ovaries are visualized and contain normal appearing follicles.  Other:  Trace amount of free fluid within the pelvis.  Musculoskeletal: No abnormal muscle or marrow signal.  IMPRESSION: 1. Findings felt consistent with interstitial ectopic pregnancy involving the right cornua of the uterus. Large heterogenous collection within the right uterus, consistent with large subchorionic hemorrhage.  Critical Value/emergent results were called by telephone at the time of interpretation on 09/22/2018 at 2:00 am to Dr. Vivien Rota , who verbally acknowledged these results.   Electronically Signed   By: Donavan Foil M.D.   On: 09/22/2018 02:00  Assessment & Plan:  Pt stable *GYN: d/w pt re: only option is surgical intervention and recommend ex-lap, right salpingectomy, pt amenable to procedure. 2U PRBCs on hold *Dispo: likely home tomorrow  Interpreter used  Code Status: Full Code  Total time taking care of the patient was 52minutes, with greater than 50% of the time spent in face to face interaction with the patient.  Durene Romans MD Attending Center for [place] ([facility]) GYN Consult Phone: [phone] (M-F, [phone]) & [phone] (Off hours, weekends, holidays)

## 2018-09-22 NOTE — Plan of Care (Signed)
  Problem: Education: Goal: Knowledge of General Education information will improve Description: Including pain rating scale, medication(s)/side effects and non-pharmacologic comfort measures Outcome: Progressing   Problem: Health Behavior/Discharge Planning: Goal: Ability to manage health-related needs will improve Outcome: Progressing   Problem: Clinical Measurements: Goal: Will remain free from infection Outcome: Progressing Goal: Cardiovascular complication will be avoided Outcome: Progressing   Problem: Activity: Goal: Risk for activity intolerance will decrease Outcome: Progressing   Problem: Nutrition: Goal: Adequate nutrition will be maintained Outcome: Progressing   Problem: Coping: Goal: Level of anxiety will decrease Outcome: Progressing   Problem: Elimination: Goal: Will not experience complications related to urinary retention Outcome: Progressing   Problem: Pain Managment: Goal: General experience of comfort will improve Outcome: Progressing   Problem: Safety: Goal: Ability to remain free from injury will improve Outcome: Progressing   Problem: Skin Integrity: Goal: Risk for impaired skin integrity will decrease Outcome: Progressing   

## 2018-09-22 NOTE — Progress Notes (Signed)
Patient got up with assistance to go to the bathroom per order pt had bathroom privileges. Patient walked to the bathroom and became weak when she sat on the toilet. Izora Gala, RN and [place], [place] called this RN to the bathroom. Upon my arrival the patient was pale and diaphoretic. We helped the patient back to a chair. Vitals signs taken and B/P was in the 90's. I called Anyanwu, MD who ordered stat labs, a bolus of fluid and strict bedrest orders. We put the patient back to bed and this RN administered the bolus. Vitals signs were retaken and were stable. MD made aware. Will continue to monitor this patient.

## 2018-09-23 ENCOUNTER — Encounter (HOSPITAL_COMMUNITY): Payer: Self-pay | Admitting: Obstetrics and Gynecology

## 2018-09-23 LAB — BASIC METABOLIC PANEL
Anion gap: 9 (ref 5–15)
BUN: <5 mg/dL — ABNORMAL LOW (ref 6–20)
CO2: 24 mmol/L (ref 22–32)
Calcium: 8.7 mg/dL — ABNORMAL LOW (ref 8.9–10.3)
Chloride: 102 mmol/L (ref 98–111)
Creatinine, Ser: 0.52 mg/dL (ref 0.44–1.00)
GFR calc Af Amer: >60 mL/min (ref >60)
GFR calc non Af Amer: >60 mL/min (ref >60)
Glucose, Bld: 119 mg/dL — ABNORMAL HIGH (ref 70–99)
Potassium: 3.8 mmol/L (ref 3.5–5.1)
Sodium: 135 mmol/L (ref 135–145)

## 2018-09-23 LAB — HCG, QUANTITATIVE, PREGNANCY: hCG, Beta Chain, Quant, S: 43735 m[IU]/mL — ABNORMAL HIGH (ref <5)

## 2018-09-23 LAB — CBC
HCT: 24.8 % — ABNORMAL LOW (ref 36.0–46.0)
HCT: 25.5 % — ABNORMAL LOW (ref 36.0–46.0)
Hemoglobin: 8.7 g/dL — ABNORMAL LOW (ref 12.0–15.0)
Hemoglobin: 8.8 g/dL — ABNORMAL LOW (ref 12.0–15.0)
MCH: 30.4 pg (ref 26.0–34.0)
MCH: 30.9 pg (ref 26.0–34.0)
MCHC: 35.1 g/dL (ref 30.0–36.0)
MCHC: 34.5 g/dL (ref 30.0–36.0)
MCV: 86.7 fL (ref 80.0–100.0)
MCV: 89.5 fL (ref 80.0–100.0)
Platelets: 190 10*3/uL (ref 150–400)
Platelets: 192 10*3/uL (ref 150–400)
RBC: 2.86 MIL/uL — ABNORMAL LOW (ref 3.87–5.11)
RBC: 2.85 MIL/uL — ABNORMAL LOW (ref 3.87–5.11)
RDW: 11.9 % (ref 11.5–15.5)
RDW: 12 % (ref 11.5–15.5)
WBC: 11.2 10*3/uL — ABNORMAL HIGH (ref 4.0–10.5)
WBC: 10.5 10*3/uL (ref 4.0–10.5)
nRBC: 0 % (ref 0.0–0.2)
nRBC: 0 % (ref 0.0–0.2)

## 2018-09-23 MED ORDER — LIDOCAINE 5 % EX PTCH
1.0000 | MEDICATED_PATCH | CUTANEOUS | Status: AC
  Administered 2018-09-23: 1 via TRANSDERMAL
  Filled 2018-09-23: qty 1

## 2018-09-23 MED ORDER — ZOLPIDEM TARTRATE 5 MG PO TABS
5.0000 mg | ORAL_TABLET | Freq: Every evening | ORAL | Status: DC | PRN
  Administered 2018-09-23 (×2): 5 mg via ORAL
  Filled 2018-09-23 (×2): qty 1

## 2018-09-23 MED ORDER — HYDROMORPHONE HCL 2 MG PO TABS
2.0000 mg | ORAL_TABLET | Freq: Four times a day (QID) | ORAL | Status: DC | PRN
  Administered 2018-09-23: 4 mg via ORAL
  Administered 2018-09-23: 2 mg via ORAL
  Filled 2018-09-23: qty 1
  Filled 2018-09-23: qty 2

## 2018-09-23 MED ORDER — POLYSACCHARIDE IRON COMPLEX 150 MG PO CAPS
150.0000 mg | ORAL_CAPSULE | Freq: Every day | ORAL | Status: DC
  Administered 2018-09-24: 09:00:00 150 mg via ORAL
  Filled 2018-09-23: qty 1

## 2018-09-23 NOTE — Progress Notes (Signed)
Gynecology Post Op Check Note  Admission Date: 09/21/2018 Current Date: 09/23/2018 8:50 AM  Dawn Bullock is a 28 y.o. G2P1011 POD#1/ex-lap removal of interstitial pregnancy/right salpingectomy/HD#3 admitted for presumed cornual ectopic pregnancy   History complicated by: Patient Active Problem List   Diagnosis Date Noted  . Status post surgery 09/22/2018  . Pregnancy, ectopic, cornual or cervical 09/21/2018  . Threatened abortion 09/21/2018  . Cornual pregnancy 09/21/2018  . Language barrier, cultural differences 09/04/2018  . Bleeding in early pregnancy 09/04/2018  . Subchorionic hemorrhage in first trimester 09/04/2018    Subjective:  Having incision pain particularly on right. Pain not really helped with PO but with IV. Taking some PO liquids. +nausea but no vomiting. Hasn't really eaten anything. Pt voiding w/o issue.   Objective:    Current Vital Signs 24h Vital Sign Ranges  T 98.4 F (36.9 C) Temp  Avg: 97.8 F (36.6 C)  Min: 97.1 F (36.2 C)  Max: 98.5 F (36.9 C)  BP 102/65 BP  Min: 100/58  Max: 142/129  HR 74 Pulse  Avg: 84.4  Min: 70  Max: 100  RR 17 Resp  Avg: 17.2  Min: 14  Max: 23  SaO2 99 % Room Air SpO2  Avg: 99.8 %  Min: 98 %  Max: 100 %       24 Hour I/O Current Shift I/O  Time Ins Outs 07/14 0701 - 07/15 0700 In: 3337.8 [P.O.:60; I.V.:2777.8] Out: 500 [Urine:350] No intake/output data recorded.   Physical exam: General appearance: NAD, tired, sitting on bedside commode Abdomen: soft, +BS, minimally ttp, non distended. C/d/i honeycomb dressing Lungs: clear to auscultation bilaterally Heart: S1, S2 normal, no murmur, rub or gallop, regular rate and rhythm Skin: warm and dry Psych: appropriate Neurologic: Grossly normal  Medications Current Facility-Administered Medications  Medication Dose Route Frequency Provider Last Rate Last Dose  . acetaminophen (TYLENOL) tablet 650 mg  650 mg Oral Q4H Aletha Halim, MD   650 mg at 09/23/18 0844   . ibuprofen (ADVIL) tablet 800 mg  800 mg Oral Q8H Aletha Halim, MD   800 mg at 09/22/18 1415  . lactated ringers infusion   Intravenous Continuous Aletha Halim, MD 100 mL/hr at 09/23/18 0300    . ondansetron (ZOFRAN) tablet 4 mg  4 mg Oral Q6H PRN Aletha Halim, MD       Or  . ondansetron (ZOFRAN) injection 4 mg  4 mg Intravenous Q6H PRN Aletha Halim, MD      . oxyCODONE (Oxy IR/ROXICODONE) immediate release tablet 5-10 mg  5-10 mg Oral Q4H PRN Aletha Halim, MD   5 mg at 09/23/18 0139  . pantoprazole (PROTONIX) EC tablet 40 mg  40 mg Oral q1800 Aletha Halim, MD   40 mg at 09/22/18 1721  . polyethylene glycol (MIRALAX / GLYCOLAX) packet 17 g  17 g Oral q1800 Aletha Halim, MD   17 g at 09/22/18 1721  . simethicone (MYLICON) chewable tablet 80 mg  80 mg Oral TID Melvern Sample, MD   80 mg at 09/23/18 0844  . zolpidem (AMBIEN) tablet 5 mg  5 mg Oral QHS PRN Roma Schanz, CNM   5 mg at 09/23/18 0334     Labs  No new labs  Radiology No new imaging  Assessment & Plan:  Pt stable Will switch to po dilaudid from oxycodone, do lidoderm patch.  Rpt cbc now to ensure H/H stable Continue regular diet. SLIV SCDs, OOB ad lib Interpreter used  Code  Status: Full Code  Cornelia Copaharlie Twylla Arceneaux, Jr MD Attending Center for [facility](Faculty Practice) GYN Consult Phone: [phone] (M-F, 0800-1700) & [phone] (Off hours, weekends, holidays)

## 2018-09-23 NOTE — Progress Notes (Signed)
GYN Update Note  S: taking liquids and some food w/o issue. +flatus AF VS normal and stable NAD, lying in bed +BS, soft, minimally ttp, nd. C/d/i honeycomb dressing. lidoderm patch in place  CBC Latest Ref Rng & Units 09/23/2018 09/23/2018 09/22/2018  WBC 4.0 - 10.5 K/uL 10.5 11.2(H) 13.7(H)  Hemoglobin 12.0 - 15.0 g/dL 8.8(L) 8.7(L) 10.0(L)  Hematocrit 36.0 - 46.0 % 25.5(L) 24.8(L) 28.4(L)  Platelets 150 - 400 K/uL 192 190 200   A/p: pt doing well Long d/w pt and husband. She states that she feels dizzy when she stands but sounds like due to the discomfort. She also says that she has low BP before pregnancy when she felt depressed and she is wondering if that is what is going on now. I gave her expectations in terms of post op healing and it's okay to grieve, but that she's fornuate the pregnancy hadn't ruptured and she should heal very well.  Husband and pt are understanding.  I told them I'll get some orthostatics just to check but I suspect it'll be fine given her H/h and her age. Pt and husband wondering if okay to get up and walk. Nursing thought pt on bedrest but I told them to definitely help to get her oob; they will get her up and out of bed and walk around.   Interpreter used  Aletha Halim, Brooke Bonito MD Attending Center for [employer] (Faculty Practice) 09/23/2018 Time: 4034

## 2018-09-23 NOTE — Progress Notes (Signed)
Spoke to patient's husband at 2130 and updated him on the patient's plan of care.

## 2018-09-23 NOTE — Plan of Care (Signed)
  Problem: Clinical Measurements: Goal: Ability to maintain clinical measurements within normal limits will improve Outcome: Adequate for Discharge   Problem: Activity: Goal: Risk for activity intolerance will decrease Outcome: Adequate for Discharge   Problem: Nutrition: Goal: Adequate nutrition will be maintained Outcome: Adequate for Discharge   Problem: Safety: Goal: Ability to remain free from injury will improve Outcome: Adequate for Discharge

## 2018-09-24 MED ORDER — ACETAMINOPHEN 325 MG PO TABS: 650.0000 mg | ORAL_TABLET | ORAL | 0 refills | Status: DC | PRN

## 2018-09-24 MED ORDER — POLYETHYLENE GLYCOL 3350 17 G PO PACK: 17.0000 g | PACK | Freq: Every day | ORAL | 1 refills | Status: DC

## 2018-09-24 MED ORDER — POLYSACCHARIDE IRON COMPLEX 150 MG PO CAPS: 150.0000 mg | ORAL_CAPSULE | Freq: Every day | ORAL | 0 refills | Status: DC

## 2018-09-24 MED ORDER — SIMETHICONE 80 MG PO CHEW: 80.0000 mg | CHEWABLE_TABLET | Freq: Four times a day (QID) | ORAL | 0 refills | Status: DC | PRN

## 2018-09-24 MED ORDER — IBUPROFEN 800 MG PO TABS: 800.0000 mg | ORAL_TABLET | Freq: Three times a day (TID) | ORAL | 0 refills | Status: DC | PRN

## 2018-09-24 MED ORDER — HYDROMORPHONE HCL 2 MG PO TABS: 2.0000 mg | ORAL_TABLET | Freq: Four times a day (QID) | ORAL | 0 refills | Status: DC | PRN

## 2018-09-24 MED FILL — POLY-IRON 150 MG CAPSULE: 150 | 30 days supply | Qty: 30 | Fill #0

## 2018-09-24 MED FILL — SM GAS RELIEF 80 MG CHEW: 80 | 8 days supply | Qty: 30 | Fill #0

## 2018-09-24 MED FILL — POLYETHYLENE GLYCOL 3350 PO: 17 | 28 days supply | Qty: 510 | Fill #0

## 2018-09-24 MED FILL — IBUPROFEN 800 MG TAB: 800 | 10 days supply | Qty: 30 | Fill #0

## 2018-09-24 MED FILL — ACETAMINOPHEN 325 MG TABS: 325 | 4 days supply | Qty: 30 | Fill #0

## 2018-09-24 NOTE — Discharge Summary (Addendum)
Discharge Summary   Admit Date: 09/21/2018 Discharge Date: 09/24/2018 Discharging Service: Gynecology  Primary OBGYN: [facility] Admitting Physician: Blanco Bingharlie Majd Tissue, MD  Discharge Physician: [facility]  Primary Care Provider: Patient, No Pcp Per  Admission Diagnoses: *Single live intrauterine pregnancy at 8 weeks *?interstitial pregnancy *Chronic threatened abortion  Discharge Diagnoses: *Status post surgical intervention for interstitial pregnancy  Consult Orders: None   Surgeries/Procedures Performed: 7/14 exploratory laparotomy, hysterotomy, evacuation of pregnancy, right salpingectomy  History and Physical: Obstetrics & Gynecology H&P   Date of Admission: 09/21/2018   Primary OBGYN: None Primary Care Provider: Patient, No Pcp Per  Reason for Admission: ?cornual ectopic  History of Present Illness: Dawn Bullock is a 28 y.o. G2P1001 (Patient's last menstrual period was 07/26/2018.), with the above CC. PMHx is significant for nothing.  Patient prefers female providers but okay with me for interview.   Patient initially seen on 6/14 in the MAU for VB. HCG was 7878 and u/s showed a YS and GS with MSD c/w 5/3 weeks at 7.126mm. No fetal pole seen. Patient seen in the MAU the next day for more VB and u/s was unchanged except for seeing a moderate subchorionic hemorrhage. Patient seen on 7/2 for VB and HCG was [id number] with u/s showing MSD of 10.88 c/w 5/5wks with subchorionic hemorrhage described as "large". Plan at that time was to keep her 7/13 u/s appt.  Today, u/s results below  No VB but states has been bleeding for the past month and has about a pad or two per day of bleeding. Patient with mild abdominal discomfort currently.   ROS: A 12-point review of systems was performed and negative, except as stated in the above HPI.  OBGYN History: As per HPI.                 OB History  Gravida Para Term Preterm AB Living  2 1 1     1   SAB TAB  Ectopic Multiple Live Births             1       # Outcome Date GA Lbr Len/2nd Weight Sex Delivery Anes PTL Lv  2 Current           1 Term 2016     Vag-Spont   LIV    Periods: qmonth, regular Patient states this pregnancy was naturally conceived. She denies any GYN surgeries.    Past Medical History:     Past Medical History:  Diagnosis Date  . Medical history non-contributory     Past Surgical History:      Past Surgical History:  Procedure Laterality Date  . NO PAST SURGERIES      Family History:  History reviewed. No pertinent family history.  Social History:  Social History        Socioeconomic History  . Marital status: Married    Spouse name: Not on file  . Number of children: Not on file  . Years of education: Not on file  . Highest education level: Not on file  Occupational History  . Not on file  Social Needs  . Financial resource strain: Not on file  . Food insecurity    Worry: Not on file    Inability: Not on file  . Transportation needs    Medical: Not on file    Non-medical: Not on file  Tobacco Use  . Smoking status: Never Smoker  . Smokeless tobacco: Never Used  Substance and Sexual Activity  .  Alcohol use: Not Currently  . Drug use: Not Currently  . Sexual activity: Not Currently    Comment: had sex 3 weeks ago  Lifestyle  . Physical activity    Days per week: Not on file    Minutes per session: Not on file  . Stress: Not on file  Relationships  . Social Musicianconnections    Talks on phone: Not on file    Gets together: Not on file    Attends religious service: Not on file    Active member of club or organization: Not on file    Attends meetings of clubs or organizations: Not on file    Relationship status: Not on file  . Intimate partner violence    Fear of current or ex partner: Not on file    Emotionally abused: Not on file    Physically abused: Not on file    Forced  sexual activity: Not on file  Other Topics Concern  . Not on file  Social History Narrative  . Not on file    Allergy: No Known Allergies  Current Outpatient Medications: Medications Prior to Admission  Medication Sig Dispense Refill Last Dose  . doxylamine, Sleep, (UNISOM) 25 MG tablet Take 1 tablet (25 mg total) by mouth every 8 (eight) hours as needed. 30 tablet 0   . Prenatal MV & Min w/FA-DHA (ONE A DAY PRENATAL PO) Take by mouth.     . pyridOXINE (VITAMIN B-6) 25 MG tablet Take 1 tablet (25 mg total) by mouth every 8 (eight) hours. 30 tablet 0      Hospital Medications: No current facility-administered medications for this encounter.      Physical Exam:  Current Vital Signs 24h Vital Sign Ranges  T 97.6 F (36.4 C) Temp  Avg: 97.6 F (36.4 C)  Min: 97.6 F (36.4 C)  Max: 97.6 F (36.4 C)  BP 115/65 BP  Min: 115/65  Max: 115/65  HR 93 Pulse  Avg: 93  Min: 93  Max: 93  RR 18 Resp  Avg: 18  Min: 18  Max: 18  SaO2     No data recorded       24 Hour I/O Current Shift I/O  Time Ins Outs No intake/output data recorded. No intake/output data recorded.   Patient Vitals for the past 24 hrs:  BP Temp Pulse Resp  09/21/18 1529 115/65 97.6 F (36.4 C) 93 18    There is no height or weight on file to calculate BMI. General appearance: Well nourished, well developed female in no acute distress.  Patient declines exam  Laboratory: No new labs. Pt is Rh pos  Imaging:  CLINICAL DATA: Vaginal bleeding in pregnancy. First trimester. Patient was 5 weeks 5 days pregnant based on her ultrasound dated 09/10/2018.  EXAM: TRANSVAGINAL OB ULTRASOUND  TECHNIQUE: Transvaginal ultrasound was performed for complete evaluation of the gestation as well as the maternal uterus, adnexal regions, and pelvic cul-de-sac.  COMPARISON: 09/10/2018.  FINDINGS: Intrauterine gestational sac: Single  Yolk sac: Not Visualized.  Embryo: Not  Visualized.  Cardiac Activity: Not Visualized.  Heart Rate: 174 bpm  CRL: 17.5 mm 8 w 1 d US EDC: 05/02/2019  Subchorionic hemorrhage: Large subchronic hemorrhage measuring approximately 8.1 x 5.6 x 6.2 cm.  Maternal uterus/adnexae: The gestational sac lies in the right upper uterus, suspicious for a cornual pregnancy. This is supported by a thin surrounding myometrium.  No uterine masses.  Right ovarian corpus luteum. Ovaries normal in overall size. No  adnexal masses.  Small amount of pelvic free fluid containing internal echoes.  IMPRESSION: 1. Single live intrauterine pregnancy with a measured gestational age of [redacted] weeks 1 day. 2. The position of the gestational sac is concerning for a right cornual pregnancy. There is a large subchorionic hemorrhage and a small amount of pelvic free fluid containing internal echoes, possibly hemorrhage. Critical Value/emergent results were called by telephone at the time of interpretation on 09/21/2018 at 1:48 pm to obstetrician on-call, who verbally acknowledged these results.  Electronically Signed: By: Lajean Manes M.D. On: 09/21/2018 13:49  Error in the above report. The yolk sac, embryo and cardiac activity should be reported as visualized, instead of not visualized. There was a definitive yolk sac, well seen embryo and positive cardiac activity.  Assessment: Ms. Nass is a 28 y.o. G2P1001 (Patient's last menstrual period was 07/26/2018.) with possible cornual ectopic, threatened AB. Pt stable  Plan: *Admit to GYN. Follow up admit labs. D/w her recommend MRI pelvis to see exactly where pregnancy is. It is concerning that she had a quant of 34k and no viable IUP seen and her MSD only rose slightly in her prior u/s which makes it concerning for abnormal pregnancy whether intrauterine or as an ectopic. Patient amenable to transfusion PRN. Two units ordered for crossmatch *FEN/GI: MIVF, NPO *PPx:  SCDs *Pain: no current needs *Dispo: pending MRI  Interpreter used   Total time taking care of the patient was 20 minutes, with greater than 50% of the time spent in face to face interaction with the patient.  Durene Romans MD Attending Center for [place] (Faculty Practice) GYN Consult Phone: [phone] (M-F, 0800-1700) & [phone] (Off hours, weekends, holidays)         Electronically signed by Aletha Halim, MD at 09/21/2018 4:59 PM   Hospital Course: Patient underwent MRI after admission that confirmed right interstitial pregnancy. She underwent the above surgery and had an uncomplicated post op course and was meeting all goals on the day of discharge including flatus. Will do weekly betas to ensure resolution.   7/15 beta hcg was 43,735.   CBC Latest Ref Rng & Units 09/23/2018 09/23/2018 09/22/2018  WBC 4.0 - 10.5 K/uL 10.5 11.2(H) 13.7(H)  Hemoglobin 12.0 - 15.0 g/dL 8.8(L) 8.7(L) 10.0(L)  Hematocrit 36.0 - 46.0 % 25.5(L) 24.8(L) 28.4(L)  Platelets 150 - 400 K/uL 192 190 200   B POS  Discharge Exam:   Current Vital Signs 24h Vital Sign Ranges  T 98.5 F (36.9 C) Temp  Avg: 98.4 F (36.9 C)  Min: 98.2 F (36.8 C)  Max: 98.5 F (36.9 C)  BP (!) 103/54 BP  Min: 98/53  Max: 107/57  HR 70 Pulse  Avg: 76  Min: 67  Max: 88  RR 16 Resp  Avg: 15.6  Min: 14  Max: 16  SaO2 99 % Room Air SpO2  Avg: 99.4 %  Min: 99 %  Max: 100 %       24 Hour I/O Current Shift I/O  Time Ins Outs 07/15 0701 - 07/16 0700 In: 750 [P.O.:750] Out: 600 [Urine:600] No intake/output data recorded.    Patient Vitals for the past 24 hrs:  BP Temp Temp src Pulse Resp SpO2  09/24/18 0424 (!) 103/54 98.5 F (36.9 C) Oral 70 16 99 %  09/23/18 2156 (!) 100/54 98.2 F (36.8 C) Oral 67 14 100 %  09/23/18 1941 (!) 105/57 98.4 F (36.9 C) Oral 71 16 99 %  09/23/18 1700 (!)  107/57 98.5 F (36.9 C) Oral 88 16 100 %  09/23/18 1538 (!) 98/53 98.5 F (36.9 C) Oral 84 16 99 %     General appearance: Well nourished, well developed female in no acute distress.  Cardiovascular: S1, S2 normal, no murmur, rub or gallop, regular rate and rhythm Respiratory:  Clear to auscultation bilateral. Normal respiratory effort Abdomen: +BS, mildly distended, nttp, c/d/i dressing Neuro/Psych:  Normal mood and affect.  Skin:  Warm and dry.   Discharge Disposition:  Home  Patient Instructions:  Standard   Results Pending at Discharge:  none  Discharge Medications: Allergies as of 09/24/2018   No Known Allergies     Medication List    TAKE these medications   acetaminophen 325 MG tablet Commonly known as: TYLENOL Take 2 tablets (650 mg total) by mouth every 4 (four) hours as needed for mild pain, moderate pain or fever.   doxylamine (Sleep) 25 MG tablet Commonly known as: UNISOM Take 1 tablet (25 mg total) by mouth every 8 (eight) hours as needed.   HYDROmorphone 2 MG tablet Commonly known as: DILAUDID Take 1-2 tablets (2-4 mg total) by mouth every 6 (six) hours as needed for severe pain.   ibuprofen 800 MG tablet Commonly known as: ADVIL Take 1 tablet (800 mg total) by mouth every 8 (eight) hours as needed.   iron polysaccharides 150 MG capsule Commonly known as: NIFEREX Take 1 capsule (150 mg total) by mouth daily. Start taking on: September 25, 2018   polyethylene glycol 17 g packet Commonly known as: MIRALAX / GLYCOLAX Take 17 g by mouth daily at 6 PM.   pyridOXINE 25 MG tablet Commonly known as: VITAMIN B-6 Take 1 tablet (25 mg total) by mouth every 8 (eight) hours.   simethicone 80 MG chewable tablet Commonly known as: MYLICON Chew 1 tablet (80 mg total) by mouth 4 (four) times daily as needed for flatulence.        Request sent for 7/22 incision check and repeat beta hcg visit with me.  Cornelia Copaharlie Yuktha Kerchner, Jr. MD Attending Center for [facility](Faculty Practice)

## 2018-09-24 NOTE — Discharge Instructions (Signed)
Surgery Care After This sheet gives you information about how to care for yourself after your procedure. Your health care provider may also give you more specific instructions. If you have problems or questions, contact your health care provider. What can I expect after the procedure? After the procedure, it is common to have:  Pain.  Tiredness (fatigue).  Poor appetite.  Lowered interest in sex.  Bleeding and discharge from your vagina. You may need to use a pad in your underwear after this procedure. Follow these instructions at home: Medicines  Take over-the-counter and prescription medicines only as told by your doctor.  Do not take aspirin or ibuprofen. These medicines can cause bleeding.  Ask your doctor if the medicine prescribed to you: ? Requires you to avoid driving or using heavy machinery. ? Can cause trouble pooping (constipation). You may need to take these actions to prevent or treat trouble pooping:  Take over-the-counter or prescription medicines.  Eat foods that are high in fiber. These include beans, whole grains, and fresh fruits and vegetables.  Limit foods that are high in fat and processed sugars. These include fried or sweet foods. Surgical cut (incision) care      Follow instructions from your doctor about how to take care of your cut from surgery (incision). Make sure you: ? Wash your hands with soap and water before and after you change your bandage (dressing). If you cannot use soap and water, use hand sanitizer. ? Change your bandage as told by your doctor. ? Leave stitches (sutures), skin glue, or skin tape (adhesive) strips in place. They may need to stay in place for 2 weeks or longer. If tape strips get loose and curl up, you may trim the loose edges. Do not remove tape strips completely unless your doctor says it is okay.  Check your cut from surgery every day for signs of infection. Check for: ? Redness, swelling, or pain. ? Fluid or  blood. ? Warmth. ? Pus or a bad smell. Activity  Rest as told by your doctor. ? Do not sit for a long time without moving. Get up to take short walks every 1-2 hours. This is important. Ask for help if you feel weak or unsteady.  Do not lift anything that is heavier than 15 lb until your doctor says that it is safe.  Do not drive or use heavy machinery while taking prescription pain medicine.  Follow your doctor's advice about exercise, driving, and general activities. Return to your normal activities as told by your doctor. Ask your doctor what activities are safe for you. Lifestyle  Do not douche, use tampons, or have sex for at least 6 weeks or as told by your doctor.  Do not drink alcohol until your doctor says it is okay.  Do not use any products that contain nicotine or tobacco, such as cigarettes, e-cigarettes, and chewing tobacco. If you need help quitting, ask your doctor. General instructions   Drink enough fluid to keep your pee (urine) pale yellow.  Do not take baths, swim, or use a hot tub until your doctor approves. Ask your doctor if you may take showers. You may only be allowed to take sponge baths.  Try to have someone at home with you for the first 1-2 weeks to help you with your daily chores at home.  Keep the bandage dry until your doctor says it can be taken off.  Wear tight-fitting (compression) stockings as told by your health care provider. These  stockings help to prevent blood clots and reduce swelling in your legs.  Keep all follow-up visits as told by your doctor. This is important. Contact a doctor if:  You have any of these signs of infection: ? Redness, swelling, or pain around your cut. ? Fluid or blood coming from your cut. ? Warmth coming from your cut. ? Pus or a bad smell coming from your cut. ? Chills or a fever.  Your cut breaks open.  You feel dizzy or light-headed.  You have pain or bleeding when you pee.  You keep having watery  poop (diarrhea).  You keep feeling like you may vomit (nauseous) or keep vomiting.  You have unusual fluid (discharge) coming from your vagina.  You have a rash.  You have any type of reaction to your medicine that is not normal, or you develop an allergy to your medicine.  Your pain medicine does not help. Get help right away if:  You have a fever and your symptoms get worse all of a sudden.  You have very bad pain in your belly (abdomen).  You are short of breath.  You faint.  You have pain, swelling, or redness of your leg.  You bleed a lot from your vagina and notice clumps of blood (clots). Summary  Do not take baths, swim, or use a hot tub until your doctor approves. Ask your doctor if you may take showers. You may only be allowed to take sponge baths.  Do not lift anything that is heavier than 15 lb (4.5 kg) until your doctor says that it is safe.  Follow your doctor's advice about exercise, driving, and general activities. Ask your doctor what activities are safe for you.  Try to have someone at home with you for the first 1-2 weeks to help you with your daily chores at home. This information is not intended to replace advice given to you by your health care provider. Make sure you discuss any questions you have with your health care provider. Document Released: 12/05/2007 Document Revised: 04/30/2018 Document Reviewed: 02/14/2016 Elsevier Patient Education  2020 [employer].

## 2018-09-24 NOTE — Plan of Care (Signed)
  Problem: Nutrition: Goal: Adequate nutrition will be maintained Outcome: Adequate for Discharge   Problem: Pain Managment: Goal: General experience of comfort will improve Outcome: Adequate for Discharge   Problem: Safety: Goal: Ability to remain free from injury will improve Outcome: Adequate for Discharge   Problem: Activity: Goal: Risk for activity intolerance will decrease 09/23/2018 2333 by Kennith Center, RN Outcome: Adequate for Discharge

## 2018-09-24 NOTE — Progress Notes (Signed)
Patient discharged to home with instructions given to her husband and medications.

## 2018-09-25 LAB — TYPE AND SCREEN
ABO/RH(D): B POS
Antibody Screen: NEGATIVE
Unit division: 0
Unit division: 0

## 2018-09-25 LAB — BPAM RBC
Blood Product Expiration Date: 202008112359
Blood Product Expiration Date: 202008112359
ISSUE DATE / TIME: 202007100453
ISSUE DATE / TIME: 202007100453
Unit Type and Rh: 7300
Unit Type and Rh: 7300

## 2018-10-02 ENCOUNTER — Encounter (HOSPITAL_COMMUNITY): Payer: Self-pay

## 2018-10-02 ENCOUNTER — Other Ambulatory Visit: Payer: Self-pay

## 2018-10-02 ENCOUNTER — Inpatient Hospital Stay (HOSPITAL_COMMUNITY)
Admission: AD | Admit: 2018-10-02 | Discharge: 2018-10-03 | Disposition: A | Discharge status: Home / Self Care | Payer: Medicaid Other | Attending: Obstetrics and Gynecology | Admitting: Obstetrics and Gynecology

## 2018-10-02 DIAGNOSIS — G8918 Other acute postprocedural pain: Secondary | ICD-10-CM | POA: Diagnosis not present

## 2018-10-02 DIAGNOSIS — R109 Unspecified abdominal pain: Secondary | ICD-10-CM | POA: Diagnosis present

## 2018-10-02 LAB — CBC WITH DIFFERENTIAL/PLATELET
Abs Immature Granulocytes: 0.03 K/uL (ref 0.00–0.07)
Basophils Absolute: 0 K/uL (ref 0.0–0.1)
Basophils Relative: 0 %
Eosinophils Absolute: 0.1 K/uL (ref 0.0–0.5)
Eosinophils Relative: 1 %
HCT: 31.1 % — ABNORMAL LOW (ref 36.0–46.0)
Hemoglobin: 10.6 g/dL — ABNORMAL LOW (ref 12.0–15.0)
Immature Granulocytes: 0 %
Lymphocytes Relative: 27 %
Lymphs Abs: 2.4 K/uL (ref 0.7–4.0)
MCH: 31.3 pg (ref 26.0–34.0)
MCHC: 34.1 g/dL (ref 30.0–36.0)
MCV: 91.7 fL (ref 80.0–100.0)
Monocytes Absolute: 0.5 K/uL (ref 0.1–1.0)
Monocytes Relative: 6 %
Neutro Abs: 5.7 K/uL (ref 1.7–7.7)
Neutrophils Relative %: 66 %
Platelets: 343 K/uL (ref 150–400)
RBC: 3.39 MIL/uL — ABNORMAL LOW (ref 3.87–5.11)
RDW: 12.6 % (ref 11.5–15.5)
WBC: 8.6 K/uL (ref 4.0–10.5)
nRBC: 0 % (ref 0.0–0.2)

## 2018-10-02 LAB — I-STAT BETA HCG BLOOD, ED (MC, WL, AP ONLY): I-stat hCG, quantitative: 955.9 m[IU]/mL — ABNORMAL HIGH

## 2018-10-02 LAB — COMPREHENSIVE METABOLIC PANEL WITH GFR
ALT: 20 U/L (ref 0–44)
AST: 19 U/L (ref 15–41)
Albumin: 4.1 g/dL (ref 3.5–5.0)
Alkaline Phosphatase: 41 U/L (ref 38–126)
Anion gap: 11 (ref 5–15)
BUN: 7 mg/dL (ref 6–20)
CO2: 24 mmol/L (ref 22–32)
Calcium: 9 mg/dL (ref 8.9–10.3)
Chloride: 104 mmol/L (ref 98–111)
Creatinine, Ser: 0.61 mg/dL (ref 0.44–1.00)
GFR calc Af Amer: >60 mL/min
GFR calc non Af Amer: >60 mL/min
Glucose, Bld: 96 mg/dL (ref 70–99)
Potassium: 3.5 mmol/L (ref 3.5–5.1)
Sodium: 139 mmol/L (ref 135–145)
Total Bilirubin: 0.3 mg/dL (ref 0.3–1.2)
Total Protein: 6.8 g/dL (ref 6.5–8.1)

## 2018-10-02 NOTE — MAU Note (Addendum)
Pt to MAU Triage. Report called from ED said pt had c/s and was having incisional pain so told she could be seen since PP. Pt came and then realized she had surgery for ectopic pregnancy. More pain R side of incison. Denies any bleeding or drainage from incison. Pain worse when gets up and walking

## 2018-10-02 NOTE — MAU Note (Signed)
Incisions clean and dry with steri strips. Edges well approximated. Pt points to R above R end of incision as to where pain is.

## 2018-10-02 NOTE — ED Triage Notes (Signed)
Pt endorses incisional pain in the abdomen x 4 days, worse today. No redness or drainage from incision. Pt had surgery due to vaginal bleeding during pregnancy on 07/14. VSS. Afebrile.

## 2018-10-03 DIAGNOSIS — G8918 Other acute postprocedural pain: Secondary | ICD-10-CM

## 2018-10-03 DIAGNOSIS — R109 Unspecified abdominal pain: Secondary | ICD-10-CM

## 2018-10-03 MED ORDER — HYDROMORPHONE HCL 2 MG PO TABS: 2.0000 mg | ORAL_TABLET | Freq: Four times a day (QID) | ORAL | 0 refills | Status: DC | PRN

## 2018-10-03 NOTE — Progress Notes (Signed)
Written and verbal d/c instructions given and understanding voiced. Pt's spouse with her and helped with d/c instructions and both voiced understanding. Advised pt to take Miralax as long as she is taking Dilaudid for pain.

## 2018-10-03 NOTE — MAU Provider Note (Signed)
Chief Complaint: Post-op Problem   First Provider Initiated Contact with Patient 10/03/18 0039      SUBJECTIVE HPI: Dawn Bullock is a 28 y.o. G2P1001 who is 11 days s/p exploratory laparotomy and right salpingectomy for ectopic pregnancy who presents to maternity admissions reporting pain was improved but 3 days ago became more severe on right lower side of abdomen.  She is taking ibuprofen, Tylenol, and Dilaudid PO as prescribed. She reports the pain is better after Dilaudid but not much better after the other medications.  She feels a knot above the incision and to the right of the incision, especially when she is standing. Her pain is not constant, it is intermittent, worse with movement and certain positions, and improved with pain medication. There are no associated symptoms, no fever/chills, no n/v.  She has not tried any other treatments. She is concerned about infection and worried that she may need surgery again. She denies vaginal bleeding, vaginal itching/burning, urinary symptoms, h/a, dizziness, n/v, or fever/chills.     HPI  Past Medical History:  Diagnosis Date  . Medical history non-contributory    Past Surgical History:  Procedure Laterality Date  . EXPLORATORY LAPAROTOMY  09/22/2018   Exploratory Laparotomy and Hysterotomy with removal of Ectopic Pregnancy (N/A)  . LAPAROTOMY N/A 09/22/2018   Procedure: Exploratory Laparotomy and Hysterotomy with removal of Ectopic Pregnancy;  Surgeon: [place] BingPickens, Charlie, MD;  [facility];  Service: Gynecology;  Laterality: N/A;  . UNILATERAL SALPINGECTOMY Right 09/22/2018   Procedure: Unilateral Salpingectomy;  Surgeon: [place] BingPickens, Charlie, MD;  [facility];  Service: Gynecology;  Laterality: Right;   Social History   Socioeconomic History  . Marital status: Married    Spouse name: Not on file  . Number of children: Not on file  . Years of education: Not on file  . Highest education level: Not on file  Occupational  History  . Not on file  Social Needs  . Financial resource strain: Not on file  . Food insecurity    Worry: Not on file    Inability: Not on file  . Transportation needs    Medical: Not on file    Non-medical: Not on file  Tobacco Use  . Smoking status: Never Smoker  . Smokeless tobacco: Never Used  Substance and Sexual Activity  . Alcohol use: Not Currently  . Drug use: Not Currently  . Sexual activity: Not Currently    Comment: had sex 3 weeks ago  Lifestyle  . Physical activity    Days per week: Not on file    Minutes per session: Not on file  . Stress: Not on file  Relationships  . Social Musicianconnections    Talks on phone: Not on file    Gets together: Not on file    Attends religious service: Not on file    Active member of club or organization: Not on file    Attends meetings of clubs or organizations: Not on file    Relationship status: Not on file  . Intimate partner violence    Fear of current or ex partner: Not on file    Emotionally abused: Not on file    Physically abused: Not on file    Forced sexual activity: Not on file  Other Topics Concern  . Not on file  Social History Narrative  . Not on file   No current facility-administered medications on file prior to encounter.    Current Outpatient Medications on File Prior to Encounter  Medication Sig Dispense Refill  . acetaminophen (TYLENOL) 325 MG tablet Take 2 tablets (650 mg total) by mouth every 4 (four) hours as needed for mild pain, moderate pain or fever. 30 tablet 0  . ibuprofen (ADVIL) 800 MG tablet Take 1 tablet (800 mg total) by mouth every 8 (eight) hours as needed. 30 tablet 0  . iron polysaccharides (NIFEREX) 150 MG capsule Take 1 capsule (150 mg total) by mouth daily. 30 capsule 0  . simethicone (MYLICON) 80 MG chewable tablet Chew 1 tablet (80 mg total) by mouth 4 (four) times daily as needed for flatulence. 30 tablet 0  . doxylamine, Sleep, (UNISOM) 25 MG tablet Take 1 tablet (25 mg total) by  mouth every 8 (eight) hours as needed. (Patient not taking: Reported on 09/23/2018) 30 tablet 0  . polyethylene glycol (MIRALAX / GLYCOLAX) 17 g packet Take 17 g by mouth daily at 6 PM. 30 each 1  . pyridOXINE (VITAMIN B-6) 25 MG tablet Take 1 tablet (25 mg total) by mouth every 8 (eight) hours. (Patient not taking: Reported on 09/23/2018) 30 tablet 0   No Known Allergies  ROS:  Review of Systems  Constitutional: Negative for chills, fatigue and fever.  HENT: Negative for sinus pressure.   Eyes: Negative for photophobia.  Respiratory: Negative for shortness of breath.   Cardiovascular: Negative for chest pain.  Gastrointestinal: Positive for abdominal pain. Negative for nausea and vomiting.  Genitourinary: Negative for difficulty urinating, dysuria, flank pain, frequency, pelvic pain, vaginal bleeding, vaginal discharge and vaginal pain.  Musculoskeletal: Negative for neck pain.  Neurological: Negative for dizziness, weakness and headaches.  Psychiatric/Behavioral: Negative.      I have reviewed patient's Past Medical Hx, Surgical Hx, Family Hx, Social Hx, medications and allergies.   Physical Exam   Patient Vitals for the past 24 hrs:  BP Temp Temp src Pulse Resp SpO2 Height Weight  10/03/18 0028 123/69 - - 62 16 - - -  10/02/18 2258 120/73 98.8 F (37.1 C) - 73 16 - 5\' 6"  (1.676 m) 66.7 kg  10/02/18 2128 112/71 - - 83 16 100 % - -  10/02/18 1739 109/73 (!) 97.4 F (36.3 C) Oral 78 19 100 % - -   Constitutional: Well-developed, well-nourished female in no acute distress.  Cardiovascular: normal rate Respiratory: normal effort GI: Abd soft, non-tender. Pos BS x 4.  Small, approximately 1-2 cm in diameter soft round mass palpable to right superior aspect of incision.    Integumentary: incision well approximated, no edema, erythema, or exudate MS: Extremities nontender, no edema, normal ROM Neurologic: Alert and oriented x 4.  GU: Neg CVAT.  PELVIC EXAM: Deferred   LAB  RESULTS Results for orders placed or performed during the hospital encounter of 10/02/18 (from the past 24 hour(s))  Comprehensive metabolic panel     Status: None   Collection Time: 10/02/18  6:06 PM  Result Value Ref Range   Sodium 139 135 - 145 mmol/L   Potassium 3.5 3.5 - 5.1 mmol/L   Chloride 104 98 - 111 mmol/L   CO2 24 22 - 32 mmol/L   Glucose, Bld 96 70 - 99 mg/dL   BUN 7 6 - 20 mg/dL   Creatinine, Ser 0.61 0.44 - 1.00 mg/dL   Calcium 9.0 8.9 - 10.3 mg/dL   Total Protein 6.8 6.5 - 8.1 g/dL   Albumin 4.1 3.5 - 5.0 g/dL   AST 19 15 - 41 U/L   ALT 20 0 - 44  U/L   Alkaline Phosphatase 41 38 - 126 U/L   Total Bilirubin 0.3 0.3 - 1.2 mg/dL   GFR calc non Af Amer >60 >60 mL/min   GFR calc Af Amer >60 >60 mL/min   Anion gap 11 5 - 15  CBC with Differential     Status: Abnormal   Collection Time: 10/02/18  6:06 PM  Result Value Ref Range   WBC 8.6 4.0 - 10.5 K/uL   RBC 3.39 (L) 3.87 - 5.11 MIL/uL   Hemoglobin 10.6 (L) 12.0 - 15.0 g/dL   HCT 40.931.1 (L) 81.136.0 - 91.446.0 %   MCV 91.7 80.0 - 100.0 fL   MCH 31.3 26.0 - 34.0 pg   MCHC 34.1 30.0 - 36.0 g/dL   RDW 78.212.6 95.611.5 - 21.315.5 %   Platelets 343 150 - 400 K/uL   nRBC 0.0 0.0 - 0.2 %   Neutrophils Relative % 66 %   Neutro Abs 5.7 1.7 - 7.7 K/uL   Lymphocytes Relative 27 %   Lymphs Abs 2.4 0.7 - 4.0 K/uL   Monocytes Relative 6 %   Monocytes Absolute 0.5 0.1 - 1.0 K/uL   Eosinophils Relative 1 %   Eosinophils Absolute 0.1 0.0 - 0.5 K/uL   Basophils Relative 0 %   Basophils Absolute 0.0 0.0 - 0.1 K/uL   Immature Granulocytes 0 %   Abs Immature Granulocytes 0.03 0.00 - 0.07 K/uL  I-Stat beta hCG blood, ED     Status: Abnormal   Collection Time: 10/02/18  6:15 PM  Result Value Ref Range   I-stat hCG, quantitative 955.9 (H) <5 mIU/mL   Comment 3            --/--/B POS (07/13 1650)  IMAGING   MAU Management/MDM: Orders Placed This Encounter  Procedures  . Comprehensive metabolic panel  . CBC with Differential  . Saline Lock  IV, Maintain IV access  . I-Stat beta hCG blood, ED  . Discharge patient Discharge disposition: 01-Home or Self Care; Discharge patient date: 10/03/2018    Meds ordered this encounter  Medications  . HYDROmorphone (DILAUDID) 2 MG tablet    Sig: Take 1 tablet (2 mg total) by mouth every 6 (six) hours as needed for severe pain.    Dispense:  20 tablet    Refill:  0    Order Specific Question:   Supervising Provider    Answer:   Reva BoresPRATT, TANYA S [[id number]]    Pt without acute abdomen.  No s/sx of infection with no fever, abdomen with minimal tenderness on exam. In absence of pelvic exam, abdominal mass may represent palpable ovary close to anterior abdominal wall vs inflammation postop.  Pt has f/u appointment on 10/06/18 with Dr Vergie LivingPickens.  Pt to keep f/u appointment, return to MAU if symptoms worsen.  Rx for Dilaudid with 20 additional tablets.  Pt may use ice and or heat PRN.  Pt discharged with strict return/infection precautions.  ASSESSMENT 1. Postoperative abdominal pain     PLAN Discharge home Allergies as of 10/03/2018   No Known Allergies     Medication List    TAKE these medications   acetaminophen 325 MG tablet Commonly known as: TYLENOL Take 2 tablets (650 mg total) by mouth every 4 (four) hours as needed for mild pain, moderate pain or fever.   doxylamine (Sleep) 25 MG tablet Commonly known as: UNISOM Take 1 tablet (25 mg total) by mouth every 8 (eight) hours as needed.   HYDROmorphone 2 MG  tablet Commonly known as: DILAUDID Take 1 tablet (2 mg total) by mouth every 6 (six) hours as needed for severe pain. What changed: how much to take   ibuprofen 800 MG tablet Commonly known as: ADVIL Take 1 tablet (800 mg total) by mouth every 8 (eight) hours as needed.   iron polysaccharides 150 MG capsule Commonly known as: NIFEREX Take 1 capsule (150 mg total) by mouth daily.   polyethylene glycol 17 g packet Commonly known as: MIRALAX / GLYCOLAX Take 17 g by mouth daily at 6  PM.   pyridOXINE 25 MG tablet Commonly known as: VITAMIN B-6 Take 1 tablet (25 mg total) by mouth every 8 (eight) hours.   simethicone 80 MG chewable tablet Commonly known as: MYLICON Chew 1 tablet (80 mg total) by mouth 4 (four) times daily as needed for flatulence.      Follow-up Information    [place] BingPickens, Charlie, MD Follow up.   Specialty: Obstetrics and Gynecology Why: As scheduled, return to MAU with worsening symptoms. Contact information: [address] [place] [address] [phone]           Sharen CounterLisa Leftwich-Kirby Certified Nurse-Midwife 10/03/2018  1:15 AM

## 2018-10-06 ENCOUNTER — Other Ambulatory Visit (HOSPITAL_COMMUNITY): Payer: Medicaid Other

## 2018-10-06 ENCOUNTER — Encounter (HOSPITAL_COMMUNITY): Payer: Self-pay

## 2018-10-06 ENCOUNTER — Inpatient Hospital Stay (HOSPITAL_COMMUNITY)
Admission: AD | Admit: 2018-10-06 | Discharge: 2018-10-06 | Disposition: A | Discharge status: Home / Self Care | Payer: Medicaid Other | Attending: Obstetrics and Gynecology | Admitting: Obstetrics and Gynecology

## 2018-10-06 ENCOUNTER — Inpatient Hospital Stay (HOSPITAL_COMMUNITY): Payer: Medicaid Other

## 2018-10-06 ENCOUNTER — Telehealth: Payer: Self-pay

## 2018-10-06 DIAGNOSIS — T8142XA Infection following a procedure, deep incisional surgical site, initial encounter: Secondary | ICD-10-CM | POA: Insufficient documentation

## 2018-10-06 DIAGNOSIS — R109 Unspecified abdominal pain: Secondary | ICD-10-CM | POA: Diagnosis not present

## 2018-10-06 DIAGNOSIS — R1031 Right lower quadrant pain: Secondary | ICD-10-CM

## 2018-10-06 DIAGNOSIS — Z5189 Encounter for other specified aftercare: Secondary | ICD-10-CM

## 2018-10-06 DIAGNOSIS — L7682 Other postprocedural complications of skin and subcutaneous tissue: Secondary | ICD-10-CM

## 2018-10-06 DIAGNOSIS — R102 Pelvic and perineal pain: Secondary | ICD-10-CM

## 2018-10-06 LAB — CBC WITH DIFFERENTIAL/PLATELET
Abs Immature Granulocytes: 0.04 10*3/uL (ref 0.00–0.07)
Basophils Absolute: 0 10*3/uL (ref 0.0–0.1)
Basophils Relative: 0 %
Eosinophils Absolute: 0 10*3/uL (ref 0.0–0.5)
Eosinophils Relative: 0 %
HCT: 33.7 % — ABNORMAL LOW (ref 36.0–46.0)
Hemoglobin: 11.4 g/dL — ABNORMAL LOW (ref 12.0–15.0)
Immature Granulocytes: 0 %
Lymphocytes Relative: 18 %
Lymphs Abs: 1.7 10*3/uL (ref 0.7–4.0)
MCH: 30.2 pg (ref 26.0–34.0)
MCHC: 33.8 g/dL (ref 30.0–36.0)
MCV: 89.4 fL (ref 80.0–100.0)
Monocytes Absolute: 0.5 10*3/uL (ref 0.1–1.0)
Monocytes Relative: 5 %
Neutro Abs: 7.1 10*3/uL (ref 1.7–7.7)
Neutrophils Relative %: 77 %
Platelets: 332 10*3/uL (ref 150–400)
RBC: 3.77 MIL/uL — ABNORMAL LOW (ref 3.87–5.11)
RDW: 12.5 % (ref 11.5–15.5)
WBC: 9.4 10*3/uL (ref 4.0–10.5)
nRBC: 0 % (ref 0.0–0.2)

## 2018-10-06 LAB — COMPREHENSIVE METABOLIC PANEL
ALT: 20 U/L (ref 0–44)
AST: 17 U/L (ref 15–41)
Albumin: 4.2 g/dL (ref 3.5–5.0)
Alkaline Phosphatase: 38 U/L (ref 38–126)
Anion gap: 10 (ref 5–15)
BUN: 7 mg/dL (ref 6–20)
CO2: 27 mmol/L (ref 22–32)
Calcium: 9.4 mg/dL (ref 8.9–10.3)
Chloride: 103 mmol/L (ref 98–111)
Creatinine, Ser: 0.65 mg/dL (ref 0.44–1.00)
GFR calc Af Amer: >60 mL/min (ref >60)
GFR calc non Af Amer: >60 mL/min (ref >60)
Glucose, Bld: 100 mg/dL — ABNORMAL HIGH (ref 70–99)
Potassium: 3.7 mmol/L (ref 3.5–5.1)
Sodium: 140 mmol/L (ref 135–145)
Total Bilirubin: 0.4 mg/dL (ref 0.3–1.2)
Total Protein: 7 g/dL (ref 6.5–8.1)

## 2018-10-06 MED ORDER — IOHEXOL 300 MG/ML  SOLN
100.0000 mL | Freq: Once | INTRAMUSCULAR | Status: AC | PRN
  Administered 2018-10-06: 100 mL via INTRAVENOUS

## 2018-10-06 MED ORDER — HYDROMORPHONE HCL 1 MG/ML IJ SOLN
1.0000 mg | Freq: Once | INTRAMUSCULAR | Status: AC
  Administered 2018-10-06: 1 mg via INTRAMUSCULAR
  Filled 2018-10-06: qty 1

## 2018-10-06 MED ORDER — IOPAMIDOL (ISOVUE-300) INJECTION 61%: 100.0000 mL | Freq: Once | INTRAVENOUS | Status: DC | PRN

## 2018-10-06 MED ORDER — DOXYCYCLINE MONOHYDRATE 100 MG PO TABS: 100.0000 mg | ORAL_TABLET | Freq: Two times a day (BID) | ORAL | 0 refills | Status: DC

## 2018-10-06 NOTE — MAU Provider Note (Signed)
History     CSN: [id number]  Arrival date and time: 10/06/18 1109   First Provider Initiated Contact with Patient 10/06/18 1202      Chief Complaint  Patient presents with  . Abdominal Pain   Ms. Dawn Bullock is a 28 y.o. G2P1001 s/p right salpingectomy on 09/22/2018 for ectopic pregnancy who presents to MAU for pain from a growth along her surgical site.  Onset: 1 week ago Location: RLQ Duration: 1 week Character: area of swelling and pain above the right side of C/S scar that pt reports gets larger when she is sitting up; pt reports pain is only with movement, but feels as though the pain is spreading more laterally along her right hand side Aggravating/Associated: movement/pt feels like the pain is migrating along the right side of her waist Relieving: lying still Treatment: PO Dilaudid, last took last night, no medication today for pain, but pt has a number of pictures of post-op medication on her phone that she reports she was given after surgery, including Tylenol and ibuprofen, but states she has not taken anything for pain since last night Severity: 8/10 when moving  Pt denies VB, vaginal discharge/odor/itching. Pt denies N/V, abdominal pain, constipation, diarrhea, or urinary problems. Pt denies fever, chills, fatigue, sweating or changes in appetite. Pt denies SOB or chest pain. Pt denies dizziness, HA, light-headedness, weakness. . Allergies? NKDA Current medications/supplements? Pt unable to name medications that she is taking, and sometimes states she is taking them and sometimes states she is not, and when asked to name her medications just states "the medications that she was given after the surgery" and is unsure if the list in the computer is correct  Arabic translation services used for entire visit.   OB History    Gravida  2   Para  1   Term  1   Preterm      AB      Living  1     SAB      TAB      Ectopic      Multiple       Live Births  1           Past Medical History:  Diagnosis Date  . Medical history non-contributory     Past Surgical History:  Procedure Laterality Date  . EXPLORATORY LAPAROTOMY  09/22/2018   Exploratory Laparotomy and Hysterotomy with removal of Ectopic Pregnancy (N/A)  . LAPAROTOMY N/A 09/22/2018   Procedure: Exploratory Laparotomy and Hysterotomy with removal of Ectopic Pregnancy;  Surgeon: [place] BingPickens, Charlie, MD;  [facility];  Service: Gynecology;  Laterality: N/A;  . UNILATERAL SALPINGECTOMY Right 09/22/2018   Procedure: Unilateral Salpingectomy;  Surgeon:  BingPickens, Charlie, MD;  [facility];  Service: Gynecology;  Laterality: Right;    No family history on file.  Social History   Tobacco Use  . Smoking status: Never Smoker  . Smokeless tobacco: Never Used  Substance Use Topics  . Alcohol use: Not Currently  . Drug use: Not Currently    Allergies: No Known Allergies  No medications prior to admission.    Review of Systems  Constitutional: Negative for chills, diaphoresis, fatigue and fever.  Respiratory: Negative for shortness of breath.   Cardiovascular: Negative for chest pain.  Gastrointestinal: Positive for abdominal pain. Negative for constipation, diarrhea, nausea and vomiting.  Genitourinary: Negative for dysuria, flank pain, frequency, pelvic pain, urgency, vaginal bleeding and vaginal discharge.  Neurological: Negative for dizziness, weakness, light-headedness and headaches.  Physical Exam   Blood pressure 121/68, pulse 77, temperature 97.6 F (36.4 C), resp. rate 18, SpO2 100 %, unknown if currently breastfeeding.  Patient Vitals for the past 24 hrs:  BP Temp Pulse Resp SpO2  10/06/18 1556 121/68 - - - -  10/06/18 1146 130/72 - 77 - -  10/06/18 1123 (!) 156/100 97.6 F (36.4 C) (!) 116 18 100 %   Physical Exam  Constitutional: She is oriented to person, place, and time. She appears well-developed and well-nourished. She appears  distressed.  HENT:  Head: Normocephalic and atraumatic.  Respiratory: Effort normal.  GI: Soft. She exhibits no distension and no mass. There is abdominal tenderness in the right lower quadrant. There is guarding. There is no rigidity.  Incision well-healed and well approximated. No signs of dehiscence, redness, tenderness, drainage, bleeding. Small area of mild swelling noticed above right side of incision, no mass palpated below. Tenderness only palpated along right side of incision.  Neurological: She is alert and oriented to person, place, and time.  Skin: Skin is warm and dry. She is not diaphoretic.  Psychiatric: She has a normal mood and affect. Her behavior is normal. Judgment and thought content normal.   Results for orders placed or performed during the hospital encounter of 10/06/18 (from the past 24 hour(s))  CBC with Differential/Platelet     Status: Abnormal   Collection Time: 10/06/18 12:29 PM  Result Value Ref Range   WBC 9.4 4.0 - 10.5 K/uL   RBC 3.77 (L) 3.87 - 5.11 MIL/uL   Hemoglobin 11.4 (L) 12.0 - 15.0 g/dL   HCT 40.933.7 (L) 81.136.0 - 91.446.0 %   MCV 89.4 80.0 - 100.0 fL   MCH 30.2 26.0 - 34.0 pg   MCHC 33.8 30.0 - 36.0 g/dL   RDW 78.212.5 95.611.5 - 21.315.5 %   Platelets 332 150 - 400 K/uL   nRBC 0.0 0.0 - 0.2 %   Neutrophils Relative % 77 %   Neutro Abs 7.1 1.7 - 7.7 K/uL   Lymphocytes Relative 18 %   Lymphs Abs 1.7 0.7 - 4.0 K/uL   Monocytes Relative 5 %   Monocytes Absolute 0.5 0.1 - 1.0 K/uL   Eosinophils Relative 0 %   Eosinophils Absolute 0.0 0.0 - 0.5 K/uL   Basophils Relative 0 %   Basophils Absolute 0.0 0.0 - 0.1 K/uL   Immature Granulocytes 0 %   Abs Immature Granulocytes 0.04 0.00 - 0.07 K/uL  Comprehensive metabolic panel     Status: Abnormal   Collection Time: 10/06/18 12:29 PM  Result Value Ref Range   Sodium 140 135 - 145 mmol/L   Potassium 3.7 3.5 - 5.1 mmol/L   Chloride 103 98 - 111 mmol/L   CO2 27 22 - 32 mmol/L   Glucose, Bld 100 (H) 70 - 99 mg/dL    BUN 7 6 - 20 mg/dL   Creatinine, Ser 0.860.65 0.44 - 1.00 mg/dL   Calcium 9.4 8.9 - 57.810.3 mg/dL   Total Protein 7.0 6.5 - 8.1 g/dL   Albumin 4.2 3.5 - 5.0 g/dL   AST 17 15 - 41 U/L   ALT 20 0 - 44 U/L   Alkaline Phosphatase 38 38 - 126 U/L   Total Bilirubin 0.4 0.3 - 1.2 mg/dL   GFR calc non Af Amer >60 >60 mL/min   GFR calc Af Amer >60 >60 mL/min   Anion gap 10 5 - 15   Mr Pelvis Wo Contrast  Result Date: 09/22/2018  CLINICAL DATA:  Possible ectopic pregnancy EXAM: MRI PELVIS WITHOUT CONTRAST TECHNIQUE: Multiplanar multisequence MR imaging of the pelvis was performed. No intravenous contrast was administered. COMPARISON:  Ultrasound 09/21/2018 FINDINGS: Urinary Tract:  No abnormality visualized. Bowel:  Unremarkable visualized pelvic bowel loops. Vascular/Lymphatic: No pathologically enlarged lymph nodes. No significant vascular abnormality seen. Reproductive: 3.2 cm ovoid T2 bright lesion within the right cornua of the uterus, corresponding to the ultrasound abnormality and felt due to abnormally positioned gestational sac/interstitial ectopic pregnancy. Gestational sac is eccentric to the junctional zone and there is abnormal increased T2 signal within the myometrium of the right cornua of the uterus. Heterogenous collection containing T1 bright areas measuring approximately 6.1 x 3.9 cm within the right uterus and contiguous with the ectopic pregnancy, felt to correspond to the large subchorionic hemorrhage noted on ultrasound. The ovaries are visualized and contain normal appearing follicles. Other:  Trace amount of free fluid within the pelvis. Musculoskeletal: No abnormal muscle or marrow signal. IMPRESSION: 1. Findings felt consistent with interstitial ectopic pregnancy involving the right cornua of the uterus. Large heterogenous collection within the right uterus, consistent with large subchorionic hemorrhage. Critical Value/emergent results were called by telephone at the time of interpretation on  09/22/2018 at 2:00 am to Dr. Leroy Libman , who verbally acknowledged these results. Electronically Signed   By: Jasmine Pang M.D.   On: 09/22/2018 02:00   US Ob Transvaginal  Addendum Date: 09/21/2018   ADDENDUM REPORT: 09/21/2018 14:15 ADDENDUM: Error in the above report. The yolk sac, embryo and cardiac activity should be reported as visualized, instead of not visualized. There was a definitive yolk sac, well seen embryo and positive cardiac activity. Electronically Signed   By: Amie Portland M.D.   On: 09/21/2018 14:15   Result Date: 09/21/2018 CLINICAL DATA:  Vaginal bleeding in pregnancy. First trimester. Patient was 5 weeks 5 days pregnant based on her ultrasound dated 09/10/2018. EXAM: TRANSVAGINAL OB ULTRASOUND TECHNIQUE: Transvaginal ultrasound was performed for complete evaluation of the gestation as well as the maternal uterus, adnexal regions, and pelvic cul-de-sac. COMPARISON:  09/10/2018. FINDINGS: Intrauterine gestational sac: Single Yolk sac:  Not Visualized. Embryo:  Not Visualized. Cardiac Activity: Not Visualized. Heart Rate: 174 bpm CRL:   17.5 mm   8 w 1 d                  [place] EDC: 05/02/2019 Subchorionic hemorrhage: Large subchronic hemorrhage measuring approximately 8.1 x 5.6 x 6.2 cm. Maternal uterus/adnexae: The gestational sac lies in the right upper uterus, suspicious for a cornual pregnancy. This is supported by a thin surrounding myometrium. No uterine masses. Right ovarian corpus luteum. Ovaries normal in overall size. No adnexal masses. Small amount of pelvic free fluid containing internal echoes. IMPRESSION: 1. Single live intrauterine pregnancy with a measured gestational age of [redacted] weeks 1 day. 2. The position of the gestational sac is concerning for a right cornual pregnancy. There is a large subchorionic hemorrhage and a small amount of pelvic free fluid containing internal echoes, possibly hemorrhage. Critical Value/emergent results were called by telephone at the time of  interpretation on 09/21/2018 at 1:48 pm to obstetrician on-call, who verbally acknowledged these results. Electronically Signed: By: Amie Portland M.D. On: 09/21/2018 13:49   US Ob Transvaginal  Result Date: 09/11/2018 CLINICAL DATA:  Vaginal bleeding EXAM: TRANSVAGINAL OB ULTRASOUND TECHNIQUE: Transvaginal ultrasound was performed for complete evaluation of the gestation as well as the maternal uterus, adnexal regions, and pelvic cul-de-sac. COMPARISON:  09/03/2018  FINDINGS: Intrauterine gestational sac: Single Yolk sac:  Visualized Embryo:  Not visualized Cardiac Activity: Not visualized Heart Rate:  bpm MSD: 10.88 mm   5 w   5 d CRL:     mm    w  d                  [place] EDC: Subchorionic hemorrhage:  Large subchorionic hemorrhage Maternal uterus/adnexae: No adnexal mass or free fluid. IMPRESSION: Five week 5 day intrauterine pregnancy based on mean sac diameter. Yolk sac is visualized but no fetal pole. This could be followed with repeat ultrasound in 10-14 days to ensure expected progression. Large subchorionic hemorrhage. Electronically Signed   By: Rolm Baptise M.D.   On: 09/11/2018 00:15   Ct Abdomen Pelvis W Contrast  Result Date: 10/06/2018 CLINICAL DATA:  Worsening abdominal pain for several days. Approximately 2 weeks postop from removal of ectopic pregnancy. EXAM: CT ABDOMEN AND PELVIS WITH CONTRAST TECHNIQUE: Multidetector CT imaging of the abdomen and pelvis was performed using the standard protocol following bolus administration of intravenous contrast. CONTRAST:  164mL OMNIPAQUE IOHEXOL 300 MG/ML  SOLN COMPARISON:  None. FINDINGS: Lower Chest: No acute findings. Hepatobiliary: No hepatic masses identified. Gallbladder is unremarkable. Pancreas:  No mass or inflammatory changes. Spleen: Within normal limits in size and appearance. Adrenals/Urinary Tract: No masses identified. No evidence of hydronephrosis. Stomach/Bowel: No evidence of obstruction, inflammatory process or abnormal fluid  collections. Although the appendix is not directly visualized, no inflammatory process seen in region of the cecum or elsewhere. Vascular/Lymphatic: No pathologically enlarged lymph nodes. No abdominal aortic aneurysm. Reproductive: No mass or other significant abnormality. Tiny amount of low-attenuation free fluid in pelvic cul-de-sac. No evidence of hemoperitoneum. Other:  None. Musculoskeletal:  No suspicious bone lesions identified. IMPRESSION: Negative. No evidence of inflammatory process, hemoperitoneum, or other acute findings. Electronically Signed   By: Marlaine Hind M.D.   On: 10/06/2018 15:09   MAU Course  Procedures  MDM -abdominal pain, s/p right salpingectomy on 09/22/2018 -no signs of infection along incision -CBC: WNL -CMP: WNL -CT Abdomen/Pelvis with Contrast (per Dr. Nehemiah Settle): normal -spoke with Dr. Nehemiah Settle @1429  - CT images reviewed, believes there is a soft tissue infection developing internally along the right side of incision, start pt on doxycycline 100mg  x7days and keep f/u appt on 10/09/2018 -patient's husband on phone when discussing plan of care and results -pt discharged to home in stable condition  Orders Placed This Encounter  Procedures  . CT ABDOMEN PELVIS W CONTRAST    Standing Status:   Standing    Number of Occurrences:   1    Order Specific Question:   Does the patient have a contrast media/X-ray dye allergy?    Answer:   No    Order Specific Question:   If indicated for the ordered procedure, I authorize the administration of contrast media per Radiology protocol    Answer:   Yes    Order Specific Question:   Is Oral Contrast requested for this exam?    Answer:   Yes, Per Radiology protocol    Order Specific Question:   Radiology Contrast Protocol - do NOT remove file path    Answer:   [email or web address]  . CBC with Differential/Platelet    Standing Status:   Standing    Number of Occurrences:   1  . Comprehensive metabolic  panel    Standing Status:   Standing    Number of Occurrences:   1  .  Discharge patient    Order Specific Question:   Discharge disposition    Answer:   01-Home or Self Care [1]    Order Specific Question:   Discharge patient date    Answer:   10/06/2018   Meds ordered this encounter  Medications  . HYDROmorphone (DILAUDID) injection 1 mg  . iohexol (OMNIPAQUE) 300 MG/ML solution 100 mL  . iopamidol (ISOVUE-300) 61 % injection 100 mL  . doxycycline (ADOXA) 100 MG tablet    Sig: Take 1 tablet (100 mg total) by mouth 2 (two) times daily for 7 days.    Dispense:  14 tablet    Refill:  0    Order Specific Question:   Supervising Provider    Answer:   Alysia PennaERVIN, MICHAEL L [[id number]]     Assessment and Plan   1. Incisional pain   2. Pelvic pain   3. RLQ abdominal pain   4. Visit for wound check   5. Deep incisional surgical site infection    Allergies as of 10/06/2018   No Known Allergies     Medication List    TAKE these medications   acetaminophen 325 MG tablet Commonly known as: TYLENOL Take 2 tablets (650 mg total) by mouth every 4 (four) hours as needed for mild pain, moderate pain or fever.   doxycycline 100 MG tablet Commonly known as: ADOXA Take 1 tablet (100 mg total) by mouth 2 (two) times daily for 7 days.   doxylamine (Sleep) 25 MG tablet Commonly known as: UNISOM Take 1 tablet (25 mg total) by mouth every 8 (eight) hours as needed.   HYDROmorphone 2 MG tablet Commonly known as: DILAUDID Take 1 tablet (2 mg total) by mouth every 6 (six) hours as needed for severe pain.   ibuprofen 800 MG tablet Commonly known as: ADVIL Take 1 tablet (800 mg total) by mouth every 8 (eight) hours as needed.   iron polysaccharides 150 MG capsule Commonly known as: NIFEREX Take 1 capsule (150 mg total) by mouth daily.   polyethylene glycol 17 g packet Commonly known as: MIRALAX / GLYCOLAX Take 17 g by mouth daily at 6 PM.   pyridOXINE 25 MG tablet Commonly known as: VITAMIN  B-6 Take 1 tablet (25 mg total) by mouth every 8 (eight) hours.   simethicone 80 MG chewable tablet Commonly known as: MYLICON Chew 1 tablet (80 mg total) by mouth 4 (four) times daily as needed for flatulence.      -pt and husband questions asked and extensively answered -discussed appropriate use of pain medication and patient advised to use pain medication as prescribed by surgical provider -pt advised to keep Friday's post-op appointment for check-in on pain -discussed s/sx of antibiotics, pt advised to start ABX today -pt to return to MAU for new/worsening s/sx -pt discharged to home in stable condition  Joni Reiningicole E  10/06/2018, 5:45 PM

## 2018-10-06 NOTE — MAU Note (Signed)
Pt s/p surgery for Ectopic Pregnancy presents to MAU w/pain 8/10 for 2 days.  Has been taking pain medication prescribed at discharge.

## 2018-10-06 NOTE — MAU Note (Signed)
Video interpreter used for all conversations with this patient.

## 2018-10-06 NOTE — Discharge Instructions (Signed)
Wound Infection A wound infection happens when tiny organisms (microorganisms) start to grow in a wound. A wound infection is most often caused by bacteria. Infection can cause the wound to break open or worsen. Wound infection needs treatment. If a wound infection is left untreated, complications can occur. Untreated wound infections may lead to an infection in the bloodstream (septicemia) or a bone infection (osteomyelitis). What are the causes? This condition is most often caused by bacteria growing in a wound. Other microorganisms, like yeast and fungi, can also cause wound infections. What increases the risk? The following factors may make you more likely to develop this condition:  Having a weak body defense system (immune system).  Having diabetes.  Taking steroid medicines for a long time (chronic use).  Smoking.  Being an older person.  Being overweight.  Taking chemotherapy medicines. What are the signs or symptoms? Symptoms of this condition include:  Having more redness, swelling, or pain at the wound site.  Having more blood or fluid at the wound site.  A bad smell coming from a wound or bandage (dressing).  Having a fever.  Feeling tired or fatigued.  Having warmth at or around the wound.  Having pus at the wound site. How is this diagnosed? This condition is diagnosed with a medical history and physical exam. You may also have a wound culture or blood tests or both. How is this treated? This condition is usually treated with an antibiotic medicine.  The infection should improve 24-48 hours after you start antibiotics.  After 24-48 hours, redness around the wound should stop spreading, and the wound should be less painful. Follow these instructions at home: Medicines  Take or apply over-the-counter and prescription medicines only as told by your health care provider.  If you were prescribed an antibiotic medicine, take or apply it as told by your health  care provider. Do not stop using the antibiotic even if you start to feel better. Wound care   Clean the wound each day, or as told by your health care provider. ? Wash the wound with mild soap and water. ? Rinse the wound with water to remove all soap. ? Pat the wound dry with a clean towel. Do not rub it.  Follow instructions from your health care provider about how to take care of your wound. Make sure you: ? Wash your hands with soap and water before and after you change your dressing. If soap and water are not available, use hand sanitizer. ? Change your dressing as told by your health care provider. ? Leave stitches (sutures), skin glue, or adhesive strips in place if your wound has been closed. These skin closures may need to stay in place for 2 weeks or longer. If adhesive strip edges start to loosen and curl up, you may trim the loose edges. Do not remove adhesive strips completely unless your health care provider tells you to do that. Some wounds are left open to heal on their own.  Check your wound every day for signs of infection. Watch for: ? More redness, swelling, or pain. ? More fluid or blood. ? Warmth. ? Pus or a bad smell. General instructions  Keep the dressing dry until your health care provider says it can be removed.  Do not take baths, swim, or use a hot tub until your health care provider approves. Ask your health care provider if you may take showers. You may only be allowed to take sponge baths.  Raise (elevate)   the injured area above the level of your heart while you are sitting or lying down.  Do not scratch or pick at the wound.  Keep all follow-up visits as told by your health care provider. This is important. Contact a health care provider if:  Your pain is not controlled with medicine.  You have more redness, swelling, or pain around your wound.  You have more fluid or blood coming from your wound.  Your wound feels warm to the touch.  You have  pus coming from your wound.  You continue to notice a bad smell coming from your wound or your dressing.  Your wound that was closed breaks open. Get help right away if:  You have a red streak going away from your wound.  You have a fever. Summary  A wound infection happens when tiny organisms (microorganisms) start to grow in a wound.  This condition is usually treated with an antibiotic medicine.  Follow instructions from your health care provider about how to take care of your wound.  Contact a health care provider if your wound infection does not begin to improve in 24-48 hours, or your symptoms worsen.  Keep all follow-up visits as told by your health care provider. This is important. This information is not intended to replace advice given to you by your health care provider. Make sure you discuss any questions you have with your health care provider. Document Released: 11/24/2002 Document Revised: 10/07/2017 Document Reviewed: 10/07/2017 Elsevier Patient Education  2020 [employer]  

## 2018-10-09 ENCOUNTER — Ambulatory Visit (INDEPENDENT_AMBULATORY_CARE_PROVIDER_SITE_OTHER): Payer: Medicaid Other | Admitting: Obstetrics and Gynecology

## 2018-10-09 ENCOUNTER — Other Ambulatory Visit: Payer: Self-pay

## 2018-10-09 VITALS — BP 121/72 | HR 87 | Temp 97.8°F | Wt 146.4 lb

## 2018-10-09 DIAGNOSIS — O008 Other ectopic pregnancy without intrauterine pregnancy: Secondary | ICD-10-CM

## 2018-10-09 DIAGNOSIS — Z9889 Other specified postprocedural states: Secondary | ICD-10-CM

## 2018-10-09 MED ORDER — GABAPENTIN 100 MG PO CAPS: 300.0000 mg | ORAL_CAPSULE | Freq: Two times a day (BID) | ORAL | 0 refills | Status: DC | PRN

## 2018-10-09 NOTE — Progress Notes (Signed)
[facility] 10/09/2018  CC: regular post op  Subjective:   S/p 7/14 ex-lap right salpingectomy, hysterotomy for right sided interstitial ectopic pregnancy. Pt discharged to home on pod#2. Pt came to mau on 7/24 and 7/28 for pain and had a negative ct scan, cmp, cbc. She was discharged to home with more PO pain meds and doxycycline.  Pt states pain is stable on the right side a little above the incision. No diarrhea, constipation, fevers, chills, dysuria. Pain worse with movement  Review of Systems Pertinent items are noted in HPI.    Objective:    BP 121/72   Pulse 87   Temp 97.8 F (36.6 C)   Wt 146 lb 6.4 oz (66.4 kg)   BMI 23.63 kg/m  General:  alert  Abdomen: soft, bowel sounds active, non-tender  Incision:   healing well, no drainage, no erythema, no hernia, no seroma, no swelling, no dehiscence, incision well approximated     Assessment:    Doing well postoperatively. Operative findings again reviewed. Pathology report discussed.    Plan:  Unsure etiology of pain but ?neuropathic. Low dose gabapentin prescribed and will rtc in a few weeks to see how she's doing. Pt told to continue with lifting precautions for now. Rpt beta today  Interpreter used  Aletha Halim, Brooke Bonito MD [facility]ending [facility] [employer])

## 2018-10-10 LAB — BETA HCG QUANT (REF LAB): hCG Quant: 184 m[IU]/mL

## 2018-10-28 ENCOUNTER — Ambulatory Visit: Payer: Medicaid Other | Admitting: Obstetrics and Gynecology

## 2018-11-25 ENCOUNTER — Encounter: Payer: Self-pay | Admitting: Obstetrics and Gynecology

## 2018-11-25 ENCOUNTER — Ambulatory Visit: Payer: Medicaid Other | Admitting: Obstetrics and Gynecology

## 2018-11-26 NOTE — Progress Notes (Signed)
  Patient did not keep her GYN appointment for 11/25/2018.  Durene Romans MD [facility]ending [facility] [employer] [facility] [employer])

## 2019-05-13 ENCOUNTER — Other Ambulatory Visit: Payer: Self-pay

## 2019-05-13 ENCOUNTER — Emergency Department (HOSPITAL_COMMUNITY)
Admission: EM | Admit: 2019-05-13 | Discharge: 2019-05-14 | Disposition: A | Discharge status: Home / Self Care | Payer: Medicaid Other | Attending: Emergency Medicine | Admitting: Emergency Medicine

## 2019-05-13 ENCOUNTER — Encounter (HOSPITAL_COMMUNITY): Payer: Self-pay | Admitting: Emergency Medicine

## 2019-05-13 DIAGNOSIS — R200 Anesthesia of skin: Secondary | ICD-10-CM | POA: Diagnosis not present

## 2019-05-13 DIAGNOSIS — Z5321 Procedure and treatment not carried out due to patient leaving prior to being seen by health care provider: Secondary | ICD-10-CM | POA: Insufficient documentation

## 2019-05-13 DIAGNOSIS — R0689 Other abnormalities of breathing: Secondary | ICD-10-CM | POA: Diagnosis not present

## 2019-05-13 DIAGNOSIS — R42 Dizziness and giddiness: Secondary | ICD-10-CM | POA: Insufficient documentation

## 2019-05-13 DIAGNOSIS — R110 Nausea: Secondary | ICD-10-CM | POA: Diagnosis not present

## 2019-05-13 DIAGNOSIS — R531 Weakness: Secondary | ICD-10-CM | POA: Diagnosis not present

## 2019-05-13 DIAGNOSIS — R479 Unspecified speech disturbances: Secondary | ICD-10-CM | POA: Insufficient documentation

## 2019-05-13 DIAGNOSIS — R064 Hyperventilation: Secondary | ICD-10-CM | POA: Diagnosis not present

## 2019-05-13 DIAGNOSIS — R52 Pain, unspecified: Secondary | ICD-10-CM | POA: Diagnosis not present

## 2019-05-13 HISTORY — DX: Hypotension, unspecified: I95.9

## 2019-05-13 LAB — URINALYSIS, ROUTINE W REFLEX MICROSCOPIC
Bilirubin Urine: NEGATIVE
Glucose, UA: NEGATIVE mg/dL
Hgb urine dipstick: NEGATIVE
Ketones, ur: NEGATIVE mg/dL
Leukocytes,Ua: NEGATIVE
Nitrite: NEGATIVE
Protein, ur: NEGATIVE mg/dL
Specific Gravity, Urine: 1.009 (ref 1.005–1.030)
pH: 8 (ref 5.0–8.0)

## 2019-05-13 LAB — CBC
HCT: 36.4 % (ref 36.0–46.0)
Hemoglobin: 12.4 g/dL (ref 12.0–15.0)
MCH: 30.1 pg (ref 26.0–34.0)
MCHC: 34.1 g/dL (ref 30.0–36.0)
MCV: 88.3 fL (ref 80.0–100.0)
Platelets: 263 10*3/uL (ref 150–400)
RBC: 4.12 MIL/uL (ref 3.87–5.11)
RDW: 11.9 % (ref 11.5–15.5)
WBC: 9.8 10*3/uL (ref 4.0–10.5)
nRBC: 0 % (ref 0.0–0.2)

## 2019-05-13 LAB — BASIC METABOLIC PANEL
Anion gap: 10 (ref 5–15)
BUN: 11 mg/dL (ref 6–20)
CO2: 25 mmol/L (ref 22–32)
Calcium: 9.2 mg/dL (ref 8.9–10.3)
Chloride: 105 mmol/L (ref 98–111)
Creatinine, Ser: 0.63 mg/dL (ref 0.44–1.00)
GFR calc Af Amer: >60 mL/min (ref >60)
GFR calc non Af Amer: >60 mL/min (ref >60)
Glucose, Bld: 113 mg/dL — ABNORMAL HIGH (ref 70–99)
Potassium: 3.6 mmol/L (ref 3.5–5.1)
Sodium: 140 mmol/L (ref 135–145)

## 2019-05-13 LAB — I-STAT BETA HCG BLOOD, ED (MC, WL, AP ONLY): I-stat hCG, quantitative: <5 m[IU]/mL (ref <5)

## 2019-05-13 MED ORDER — SODIUM CHLORIDE 0.9% FLUSH: 3.0000 mL | Freq: Once | INTRAVENOUS | Status: DC

## 2019-05-13 NOTE — ED Notes (Signed)
Pt's husband requesting updates @ [phone], is waiting outside

## 2019-05-13 NOTE — ED Triage Notes (Addendum)
Per ems, pt from home. Pt speaks Arabic. Pt reports dizziness that started at 7pm tonight with left arm and lip numbness. Pt cannot recall how long the episode lasted but reports she also had difficulty speaking at the time. Been nauseous for the past week. Pt had similar episode about 2 weeks ago but just with nausea. Numbness and difficulty speaking has subsided since being with ems. Denies pain. EMS VSS CBG 141 BP 114/70, HR 80, R 18, 95% spo2 room air. 4mg  zofran given by ems with relief.    20g Left hand  Husband #: [phone]

## 2019-05-13 NOTE — ED Notes (Signed)
Pt and pt's family concerned about the price of procedures being done. Pt requesting scan to be cancelled for now d/t not having insurance.

## 2019-05-14 NOTE — ED Notes (Signed)
Pt and family reports that they will come back another day because they have their son with them and cannot wait that long. This RN encouraged pt to wait and be seen by provider. Pt requesting IV be taken out.

## 2020-01-15 IMAGING — MR MRI PELVIS WITHOUT CONTRAST
7 series · 27 of 48 positions shown · non-contrast
Comparison: Ultrasound 09/21/2018

CLINICAL DATA: Possible ectopic pregnancy

EXAM:
MRI PELVIS WITHOUT CONTRAST
TECHNIQUE: Multiplanar multisequence MR imaging of the pelvis was performed. No
intravenous contrast was administered.

[Series 2: T2 · coronal · 5.0mm · 1.41mm/px · 2 of 30 slices shown]
[im 1/30]
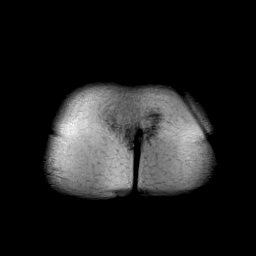
[im 30/30]
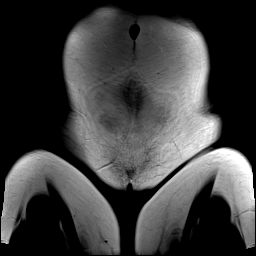

[Series 3: ax tse trig · axial · 5.0mm · 0.75mm/px · z∈[-146,+70]mm · 3 of 37 slices shown]
[im 1/37]
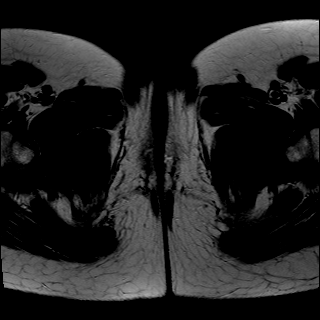
[im 19/37]
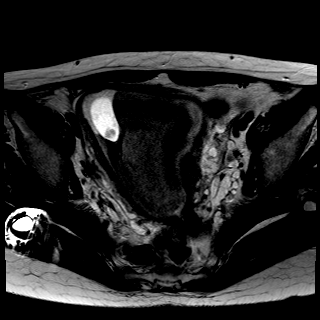
[im 37/37]
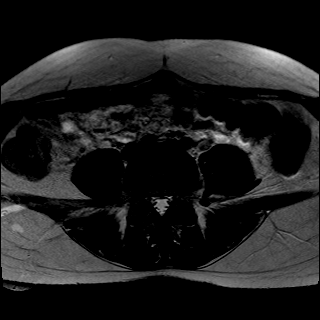

[Series 4: ax tse fs · axial · 5.0mm · 0.75mm/px · z∈[-151,+65]mm · 3 of 37 slices shown]
[im 1/37]
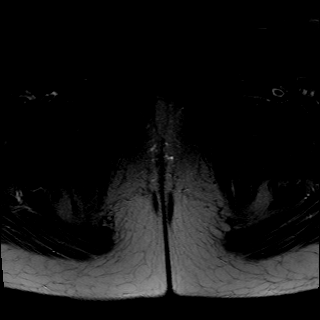
[im 19/37]
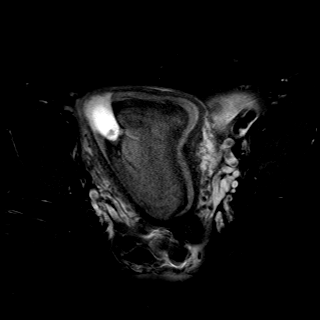
[im 37/37]
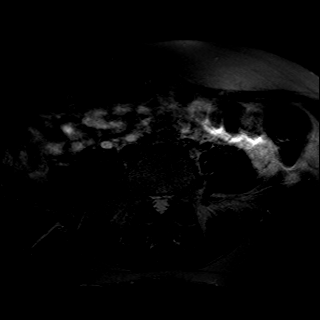

[Series 5: sag tse trig · sagittal · 5.0mm · 0.75mm/px · 2 of 33 slices shown]
[im 1/33]
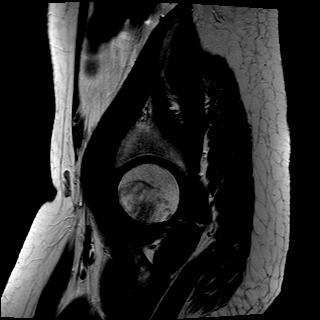
[im 33/33]
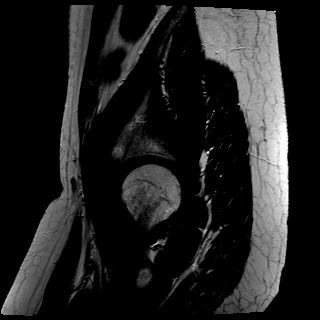

[Series 6: T1 dynamic fat-sat · axial · 1.4mm · 0.78mm/px · z∈[-133,+46]mm · 8 of 288 slices shown]
[im 16/288]
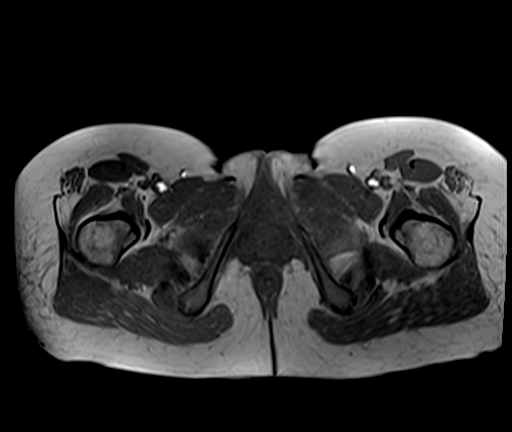
[im 46/288]
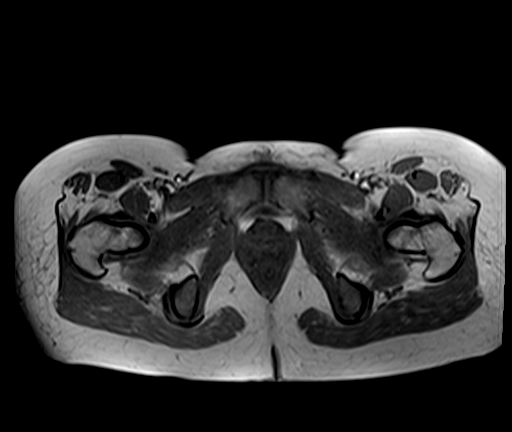
[im 91/288]
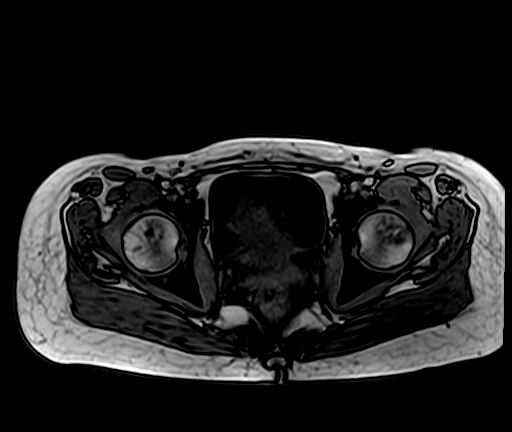
[im 121/288]
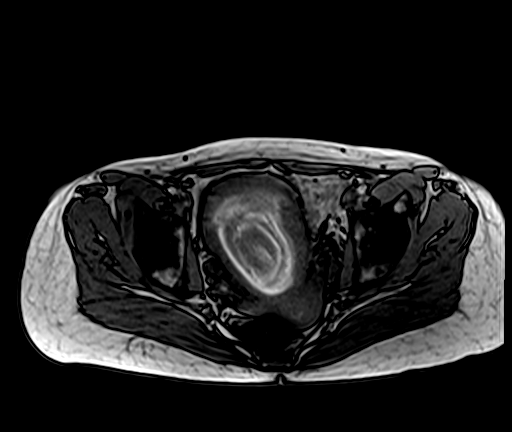
[im 167/288]
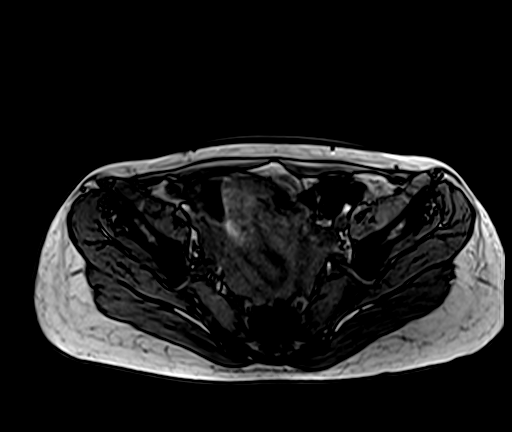
[im 197/288]
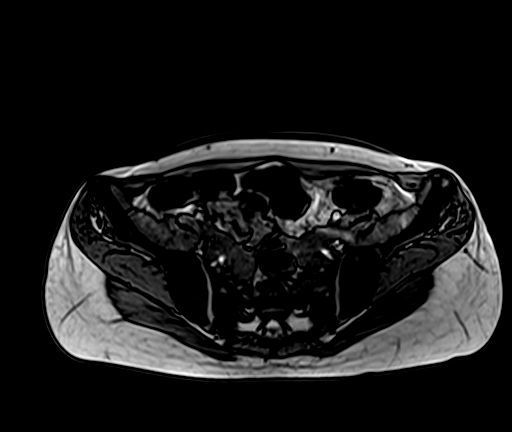
[im 242/288]
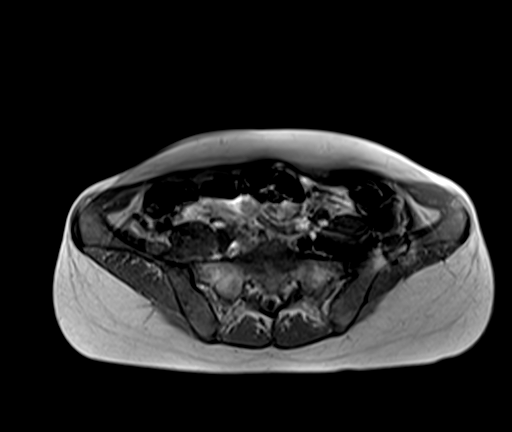
[im 272/288]
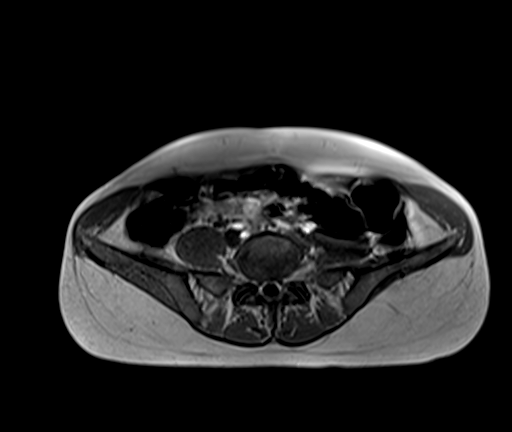

[Series 7: T1 dynamic · axial · 1.4mm · 0.78mm/px · z∈[-159,+19]mm · 8 of 128 slices shown (1 of 2)]
[im 1/128]
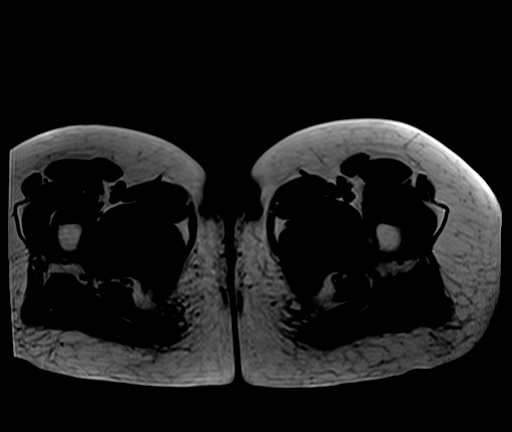
[im 16/128]
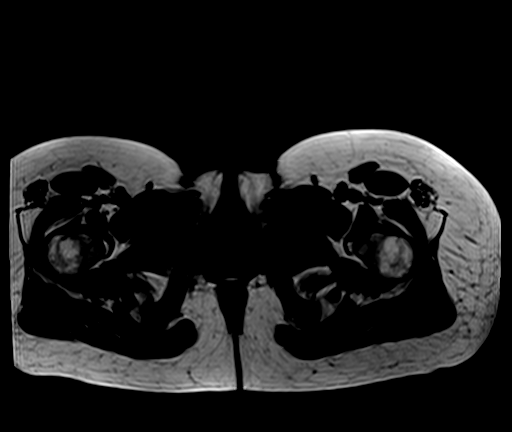
[im 32/128]
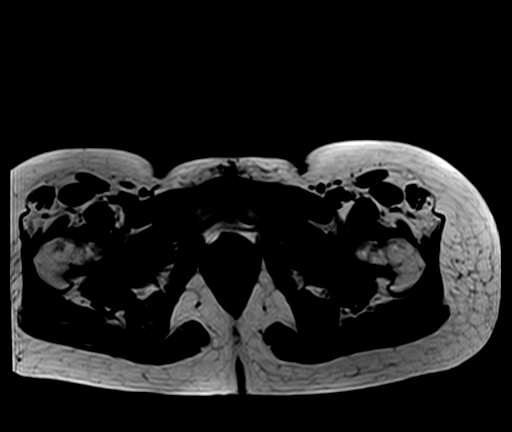
[im 48/128]
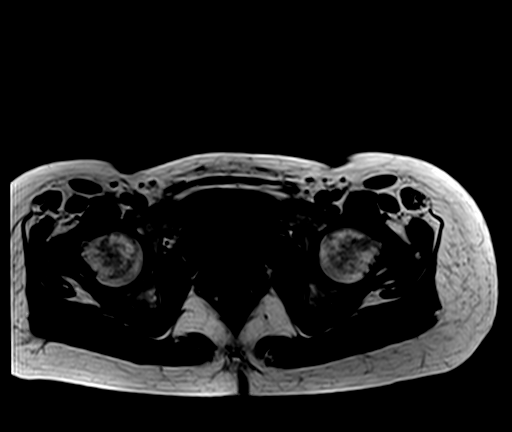
[im 80/128]
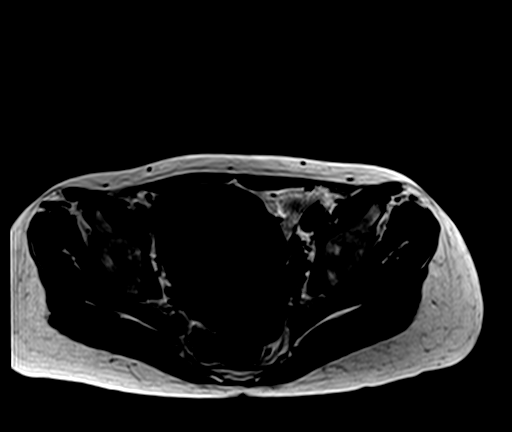
[im 96/128]
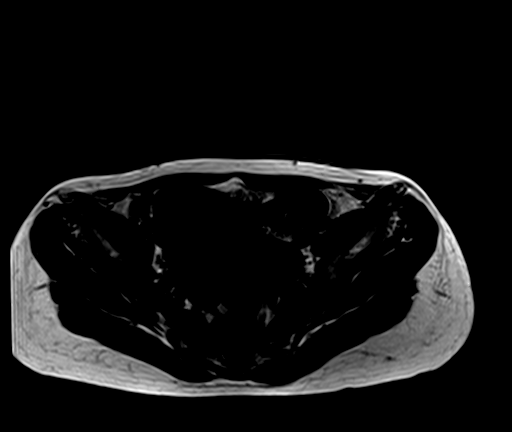
[im 112/128]
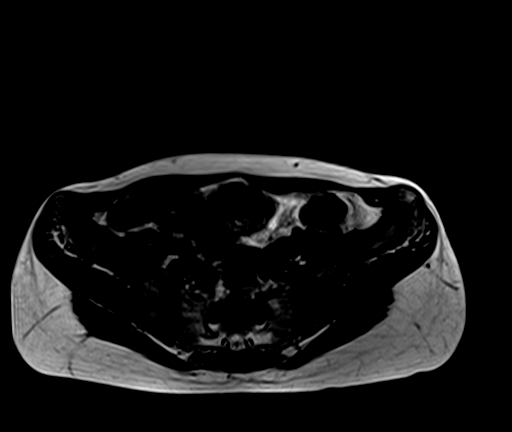
[im 128/128]
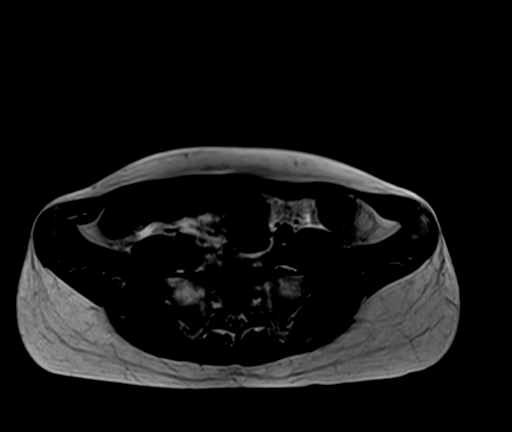

[Series 8: T1 dynamic · axial · 1.4mm · 0.78mm/px · 1 of 128 slices shown (2 of 2)]
[im 1/128]
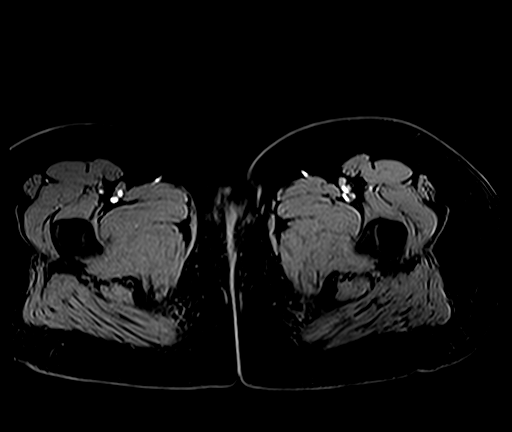

[27 of 48 positions shown; findings below may reference images not displayed]

FINDINGS: Urinary Tract:  No abnormality visualized.

Bowel:  Unremarkable visualized pelvic bowel loops.

Vascular/Lymphatic: No pathologically enlarged lymph nodes. No
significant vascular abnormality seen.

Reproductive: 3.2 cm ovoid T2 bright lesion within the right cornua
of the uterus, corresponding to the ultrasound abnormality and felt
due to abnormally positioned gestational sac/interstitial ectopic
pregnancy. Gestational sac is eccentric to the junctional zone and
there is abnormal increased T2 signal within the myometrium of the
right cornua of the uterus. Heterogenous collection containing T1
bright areas measuring approximately 6.1 x 3.9 cm within the right
uterus and contiguous with the ectopic pregnancy, felt to correspond
to the large subchorionic hemorrhage noted on ultrasound.

The ovaries are visualized and contain normal appearing follicles.

Other:  Trace amount of free fluid within the pelvis.

Musculoskeletal: No abnormal muscle or marrow signal.
IMPRESSION: 1. Findings felt consistent with interstitial ectopic pregnancy
involving the right cornua of the uterus. Large heterogenous
collection within the right uterus, consistent with large
subchorionic hemorrhage.

Critical Value/emergent results were called by telephone at the time
of interpretation on 09/22/2018 at [DATE] to Dr. Kaki Jim , who
verbally acknowledged these results.

## 2020-03-18 ENCOUNTER — Other Ambulatory Visit: Payer: Self-pay

## 2020-03-18 ENCOUNTER — Inpatient Hospital Stay (HOSPITAL_COMMUNITY): Payer: Medicaid Other

## 2020-03-18 ENCOUNTER — Encounter (HOSPITAL_COMMUNITY): Payer: Self-pay | Admitting: Family Medicine

## 2020-03-18 ENCOUNTER — Inpatient Hospital Stay (HOSPITAL_COMMUNITY)
Admission: AD | Admit: 2020-03-18 | Discharge: 2020-03-18 | Disposition: A | Discharge status: Home / Self Care | Payer: Medicaid Other | Attending: Family Medicine | Admitting: Family Medicine

## 2020-03-18 DIAGNOSIS — R102 Pelvic and perineal pain: Secondary | ICD-10-CM

## 2020-03-18 DIAGNOSIS — O468X1 Other antepartum hemorrhage, first trimester: Secondary | ICD-10-CM | POA: Diagnosis not present

## 2020-03-18 DIAGNOSIS — O208 Other hemorrhage in early pregnancy: Secondary | ICD-10-CM | POA: Insufficient documentation

## 2020-03-18 DIAGNOSIS — Z3490 Encounter for supervision of normal pregnancy, unspecified, unspecified trimester: Secondary | ICD-10-CM

## 2020-03-18 DIAGNOSIS — Z3A01 Less than 8 weeks gestation of pregnancy: Secondary | ICD-10-CM | POA: Diagnosis not present

## 2020-03-18 DIAGNOSIS — R109 Unspecified abdominal pain: Secondary | ICD-10-CM | POA: Diagnosis not present

## 2020-03-18 LAB — URINALYSIS, ROUTINE W REFLEX MICROSCOPIC
Bilirubin Urine: NEGATIVE
Glucose, UA: NEGATIVE mg/dL
Ketones, ur: NEGATIVE mg/dL
Leukocytes,Ua: NEGATIVE
Nitrite: NEGATIVE
Protein, ur: NEGATIVE mg/dL
Specific Gravity, Urine: 1.017 (ref 1.005–1.030)
pH: 6 (ref 5.0–8.0)

## 2020-03-18 LAB — CBC
HCT: 35.6 % — ABNORMAL LOW (ref 36.0–46.0)
Hemoglobin: 12.1 g/dL (ref 12.0–15.0)
MCH: 29.8 pg (ref 26.0–34.0)
MCHC: 34 g/dL (ref 30.0–36.0)
MCV: 87.7 fL (ref 80.0–100.0)
Platelets: 251 10*3/uL (ref 150–400)
RBC: 4.06 MIL/uL (ref 3.87–5.11)
RDW: 12.3 % (ref 11.5–15.5)
WBC: 9.9 10*3/uL (ref 4.0–10.5)
nRBC: 0 % (ref 0.0–0.2)

## 2020-03-18 LAB — POCT PREGNANCY, URINE: Preg Test, Ur: POSITIVE — AB

## 2020-03-18 LAB — HCG, QUANTITATIVE, PREGNANCY: hCG, Beta Chain, Quant, S: 11421 m[IU]/mL — ABNORMAL HIGH (ref <5)

## 2020-03-18 MED ORDER — PREPLUS 27-1 MG PO TABS: 1.0000 | ORAL_TABLET | Freq: Every day | ORAL | 13 refills | Status: DC

## 2020-03-18 NOTE — MAU Provider Note (Incomplete Revision)
History     CSN: [id number]  Arrival date and time: 03/18/20 1934   Event Date/Time   First Provider Initiated Contact with Patient 03/18/20 2032      Chief Complaint  Patient presents with  . Abdominal Pain  . Vaginal Bleeding   Dawn Bullock is a 30 y.o. G3P1011 at [redacted]w[redacted]d who presents today with vaginal bleeding and cramping. She states that she had spotting when she went to the bathroom yesterday, but today it is better. She was also having some cramping yesterday, but none today. She rates her pain 5/10.   Vaginal Bleeding The patient's primary symptoms include pelvic pain and vaginal bleeding. This is a new problem. The current episode started yesterday. The problem occurs intermittently. The problem has been gradually improving. The problem affects both sides. She is pregnant. Pertinent negatives include no chills, dysuria, fever, frequency, nausea, urgency or vomiting. The vaginal discharge was bloody. The vaginal bleeding is spotting. She has not been passing clots. She has not been passing tissue. Nothing aggravates the symptoms. She has tried nothing for the symptoms. Menstrual history: LMP 02/12/2020.    OB History    Gravida  2   Para  1   Term  1   Preterm      AB      Living  1     SAB      IAB      Ectopic      Multiple      Live Births  1           Past Medical History:  Diagnosis Date  . Hypotension   . Medical history non-contributory     Past Surgical History:  Procedure Laterality Date  . CESAREAN SECTION  01/2019  . EXPLORATORY LAPAROTOMY  09/22/2018   Exploratory Laparotomy and Hysterotomy with removal of Ectopic Pregnancy (N/A)  . LAPAROTOMY N/A 09/22/2018   Procedure: Exploratory Laparotomy and Hysterotomy with removal of Ectopic Pregnancy;  Surgeon: [place] Bing, MD;  Location: [facility] OR;  Service: Gynecology;  Laterality: N/A;  . UNILATERAL SALPINGECTOMY Right 09/22/2018   Procedure: Unilateral Salpingectomy;  Surgeon:  [place] Bing, MD;  Location: [facility] OR;  Service: Gynecology;  Laterality: Right;    History reviewed. No pertinent family history.  Social History   Tobacco Use  . Smoking status: Never Smoker  . Smokeless tobacco: Never Used  Substance Use Topics  . Alcohol use: Not Currently  . Drug use: Not Currently    Allergies: No Known Allergies  Medications Prior to Admission  Medication Sig Dispense Refill Last Dose  . acetaminophen (TYLENOL) 325 MG tablet Take 2 tablets (650 mg total) by mouth every 4 (four) hours as needed for mild pain, moderate pain or fever. 30 tablet 0   . doxylamine, Sleep, (UNISOM) 25 MG tablet Take 1 tablet (25 mg total) by mouth every 8 (eight) hours as needed. (Patient not taking: Reported on 09/23/2018) 30 tablet 0   . gabapentin (NEURONTIN) 100 MG capsule Take 3 capsules (300 mg total) by mouth 2 (two) times daily as needed. Nerve pain 30 capsule 0   . HYDROmorphone (DILAUDID) 2 MG tablet Take 1 tablet (2 mg total) by mouth every 6 (six) hours as needed for severe pain. 20 tablet 0   . ibuprofen (ADVIL) 800 MG tablet Take 1 tablet (800 mg total) by mouth every 8 (eight) hours as needed. (Patient not taking: Reported on 10/09/2018) 30 tablet 0   . iron polysaccharides (NIFEREX) 150 MG  capsule Take 1 capsule (150 mg total) by mouth daily. (Patient not taking: Reported on 10/09/2018) 30 capsule 0   . polyethylene glycol (MIRALAX / GLYCOLAX) 17 g packet Take 17 g by mouth daily at 6 PM. (Patient not taking: Reported on 10/09/2018) 30 each 1   . pyridOXINE (VITAMIN B-6) 25 MG tablet Take 1 tablet (25 mg total) by mouth every 8 (eight) hours. (Patient not taking: Reported on 09/23/2018) 30 tablet 0   . simethicone (MYLICON) 80 MG chewable tablet Chew 1 tablet (80 mg total) by mouth 4 (four) times daily as needed for flatulence. (Patient not taking: Reported on 10/09/2018) 30 tablet 0     Review of Systems  Constitutional: Negative for chills and fever.  Gastrointestinal:  Negative for nausea and vomiting.  Genitourinary: Positive for pelvic pain and vaginal bleeding. Negative for dysuria, frequency and urgency.   Physical Exam   Temperature 98.8 F (37.1 C), temperature source Oral, resp. rate 16, height 5' 6.54" (1.69 m), weight 76.4 kg, SpO2 100 %, unknown if currently breastfeeding.  Physical Exam Vitals and nursing note reviewed.  Constitutional:      General: She is not in acute distress. HENT:     Head: Normocephalic.  Eyes:     Pupils: Pupils are equal, round, and reactive to light.  Cardiovascular:     Rate and Rhythm: Normal rate.  Pulmonary:     Effort: Pulmonary effort is normal.  Abdominal:     Palpations: Abdomen is soft.     Tenderness: There is no abdominal tenderness.  Skin:    General: Skin is warm and dry.  Neurological:     Mental Status: She is alert and oriented to person, place, and time.  Psychiatric:        Mood and Affect: Mood normal.        Behavior: Behavior normal.     MAU Course  Procedures Results for orders placed or performed during the hospital encounter of 03/18/20 (from the past 24 hour(s))  Pregnancy, urine POC     Status: Abnormal   Collection Time: 03/18/20  8:21 PM  Result Value Ref Range   Preg Test, Ur POSITIVE (A) NEGATIVE  Urinalysis, Routine w reflex microscopic Urine, Clean Catch     Status: Abnormal   Collection Time: 03/18/20  8:24 PM  Result Value Ref Range   Color, Urine YELLOW YELLOW   APPearance CLEAR CLEAR   Specific Gravity, Urine 1.017 1.005 - 1.030   pH 6.0 5.0 - 8.0   Glucose, UA NEGATIVE NEGATIVE mg/dL   Hgb urine dipstick SMALL (A) NEGATIVE   Bilirubin Urine NEGATIVE NEGATIVE   Ketones, ur NEGATIVE NEGATIVE mg/dL   Protein, ur NEGATIVE NEGATIVE mg/dL   Nitrite NEGATIVE NEGATIVE   Leukocytes,Ua NEGATIVE NEGATIVE   RBC / HPF 6-10 0 - 5 RBC/hpf   WBC, UA 0-5 0 - 5 WBC/hpf   Bacteria, UA RARE (A) NONE SEEN   Squamous Epithelial / LPF 0-5 0 - 5  CBC     Status: Abnormal    Collection Time: 03/18/20  8:44 PM  Result Value Ref Range   WBC 9.9 4.0 - 10.5 K/uL   RBC 4.06 3.87 - 5.11 MIL/uL   Hemoglobin 12.1 12.0 - 15.0 g/dL   HCT 99.3 (L) 57.0 - 17.7 %   MCV 87.7 80.0 - 100.0 fL   MCH 29.8 26.0 - 34.0 pg   MCHC 34.0 30.0 - 36.0 g/dL   RDW 93.9 03.0 - 09.2 %  Platelets 251 150 - 400 K/uL   nRBC 0.0 0.0 - 0.2 %  hCG, quantitative, pregnancy     Status: Abnormal   Collection Time: 03/18/20  8:44 PM  Result Value Ref Range   hCG, Beta Chain, Quant, S 11,421 (H) <5 mIU/mL   US OB LESS THAN 14 WEEKS WITH OB TRANSVAGINAL  Result Date: 03/18/2020 CLINICAL DATA:  Initial evaluation for acute pelvic pain, vaginal bleeding, early pregnancy. EXAM: OBSTETRIC <14 WK [place] AND TRANSVAGINAL OB US TECHNIQUE: Both transabdominal and transvaginal ultrasound examinations were performed for complete evaluation of the gestation as well as the maternal uterus, adnexal regions, and pelvic cul-de-sac. Transvaginal technique was performed to assess early pregnancy. COMPARISON:  None available. FINDINGS: Intrauterine gestational sac: Single Yolk sac:  Present Embryo:  Negative. Cardiac Activity: Negative. Heart Rate: N/A MSD: 8.7 mm   5 w   4 d Subchorionic hemorrhage: Small subchorionic hemorrhage measuring 1.8 x 0.6 x 2.2 cm without mass effect. Maternal uterus/adnexae: Ovaries within normal limits bilaterally. Corpus luteal cyst noted on the left. No adnexal mass or free fluid. IMPRESSION: 1. Early intrauterine gestational sac with internal yolk sac, but no fetal pole or cardiac activity yet visualized. Recommend follow-up quantitative B-HCG levels and follow-up US in 14 days to confirm and assess viability. 2. Small subchorionic hemorrhage without mass effect. 3. No other acute maternal uterine or adnexal abnormality. Electronically Signed   By: Rise Mu M.D.   On: 03/18/2020 21:43    MDM  [place] and blood work pending Care turned over to [facility], CNM   Thressa Sheller DNP,  CNM  03/18/20  9:12 PM     Assessment and Plan  Reassessment (10:05 PM) IUGS and [facility]  -Results as above -Informed of findings.  -Discussed need for follow up US in 2 weeks. -Order placed.  -Rx for PNV sent to pharmacy on file. -Educated on [facility] what to expect including bleeding, risks for miscarriage, and resolution.  -Encouraged to call or return to MAU if symptoms worsen or with the onset of new symptoms. -Discharged to home in stable condition.  Cherre Robins MSN, CNM Advanced Practice Provider, [facility] [employer]

## 2020-03-18 NOTE — Discharge Instructions (Signed)
First Trimester of Pregnancy  The first trimester of pregnancy is from week 1 until the end of week 13 (months 1 through 3). During this time, your baby will begin to develop inside you. At 6-8 weeks, the eyes and face are formed, and the heartbeat can be seen on ultrasound. At the end of 12 weeks, all the baby's organs are formed. Prenatal care is all the medical care you receive before the birth of your baby. Make sure you get good prenatal care and follow all of your doctor's instructions. Follow these instructions at home: Medicines  Take over-the-counter and prescription medicines only as told by your doctor. Some medicines are safe and some medicines are not safe during pregnancy.  Take a prenatal vitamin that contains at least 600 micrograms (mcg) of folic acid.  If you have trouble pooping (constipation), take medicine that will make your stool soft (stool softener) if your doctor approves. Eating and drinking   Eat regular, healthy meals.  Your doctor will tell you the amount of weight gain that is right for you.  Avoid raw meat and uncooked cheese.  If you feel sick to your stomach (nauseous) or throw up (vomit): ? Eat 4 or 5 small meals a day instead of 3 large meals. ? Try eating a few soda crackers. ? Drink liquids between meals instead of during meals.  To prevent constipation: ? Eat foods that are high in fiber, like fresh fruits and vegetables, whole grains, and beans. ? Drink enough fluids to keep your pee (urine) clear or pale yellow. Activity  Exercise only as told by your doctor. Stop exercising if you have cramps or pain in your lower belly (abdomen) or low back.  Do not exercise if it is too hot, too humid, or if you are in a place of great height (high altitude).  Try to avoid standing for long periods of time. Move your legs often if you must stand in one place for a long time.  Avoid heavy lifting.  Wear low-heeled shoes. Sit and stand up  straight.  You can have sex unless your doctor tells you not to. Relieving pain and discomfort  Wear a good support bra if your breasts are sore.  Take warm water baths (sitz baths) to soothe pain or discomfort caused by hemorrhoids. Use hemorrhoid cream if your doctor says it is okay.  Rest with your legs raised if you have leg cramps or low back pain.  If you have puffy, bulging veins (varicose veins) in your legs: ? Wear support hose or compression stockings as told by your doctor. ? Raise (elevate) your feet for 15 minutes, 3-4 times a day. ? Limit salt in your food. Prenatal care  Schedule your prenatal visits by the twelfth week of pregnancy.  Write down your questions. Take them to your prenatal visits.  Keep all your prenatal visits as told by your doctor. This is important. Safety  Wear your seat belt at all times when driving.  Make a list of emergency phone numbers. The list should include numbers for family, friends, the hospital, and police and fire departments. General instructions  Ask your doctor for a referral to a local prenatal class. Begin classes no later than at the start of month 6 of your pregnancy.  Ask for help if you need counseling or if you need help with nutrition. Your doctor can give you advice or tell you where to go for help.  Do not use hot tubs, steam   rooms, or saunas.  Do not douche or use tampons or scented sanitary pads.  Do not cross your legs for long periods of time.  Avoid all herbs and alcohol. Avoid drugs that are not approved by your doctor.  Do not use any tobacco products, including cigarettes, chewing tobacco, and electronic cigarettes. If you need help quitting, ask your doctor. You may get counseling or other support to help you quit.  Avoid cat litter boxes and soil used by cats. These carry germs that can cause birth defects in the baby and can cause a loss of your baby (miscarriage) or stillbirth.  Visit your dentist.  At home, brush your teeth with a soft toothbrush. Be gentle when you floss. Contact a doctor if:  You are dizzy.  You have mild cramps or pressure in your lower belly.  You have a nagging pain in your belly area.  You continue to feel sick to your stomach, you throw up, or you have watery poop (diarrhea).  You have a bad smelling fluid coming from your vagina.  You have pain when you pee (urinate).  You have increased puffiness (swelling) in your face, hands, legs, or ankles. Get help right away if:  You have a fever.  You are leaking fluid from your vagina.  You have spotting or bleeding from your vagina.  You have very bad belly cramping or pain.  You gain or lose weight rapidly.  You throw up blood. It may look like coffee grounds.  You are around people who have German measles, fifth disease, or chickenpox.  You have a very bad headache.  You have shortness of breath.  You have any kind of trauma, such as from a fall or a car accident. Summary  The first trimester of pregnancy is from week 1 until the end of week 13 (months 1 through 3).  To take care of yourself and your unborn baby, you will need to eat healthy meals, take medicines only if your doctor tells you to do so, and do activities that are safe for you and your baby.  Keep all follow-up visits as told by your doctor. This is important as your doctor will have to ensure that your baby is healthy and growing well. This information is not intended to replace advice given to you by your health care provider. Make sure you discuss any questions you have with your health care provider. Document Revised: 06/18/2018 Document Reviewed: 03/05/2016 Elsevier Patient Education  2020 [employer]  

## 2020-03-18 NOTE — MAU Note (Signed)
..  Dawn Bullock is a 30 y.o. at Unknown here in MAU reporting: abdominal pain and vaginal bleeding. Patient reports a positive pregnancy test 3 days ago. Her pain began last night and it feels like period-like cramps. She notices her vaginal bleeding when she uses the restroom. Patient is anxious she had an ectopic pregnancy in 2020 and this feels similar.  LMP: 02/12/2020 Pain score: 5/10 Lab orders placed from triage: POCT pregnancy test, UA

## 2020-03-18 NOTE — MAU Provider Note (Addendum)
History     CSN: [id number]  Arrival date and time: 03/18/20 1934   Event Date/Time   First Provider Initiated Contact with Patient 03/18/20 2032      Chief Complaint  Patient presents with  . Abdominal Pain  . Vaginal Bleeding   Dawn Bullock is a 30 y.o. G3P1011 at [redacted]w[redacted]d who presents today with vaginal bleeding and cramping. She states that she had spotting when she went to the bathroom yesterday, but today it is better. She was also having some cramping yesterday, but none today. She rates her pain 5/10.   Vaginal Bleeding The patient's primary symptoms include pelvic pain and vaginal bleeding. This is a new problem. The current episode started yesterday. The problem occurs intermittently. The problem has been gradually improving. The problem affects both sides. She is pregnant. Pertinent negatives include no chills, dysuria, fever, frequency, nausea, urgency or vomiting. The vaginal discharge was bloody. The vaginal bleeding is spotting. She has not been passing clots. She has not been passing tissue. Nothing aggravates the symptoms. She has tried nothing for the symptoms. Menstrual history: LMP 02/12/2020.    OB History    Gravida  2   Para  1   Term  1   Preterm      AB      Living  1     SAB      IAB      Ectopic      Multiple      Live Births  1           Past Medical History:  Diagnosis Date  . Hypotension   . Medical history non-contributory     Past Surgical History:  Procedure Laterality Date  . CESAREAN SECTION  01/2019  . EXPLORATORY LAPAROTOMY  09/22/2018   Exploratory Laparotomy and Hysterotomy with removal of Ectopic Pregnancy (N/A)  . LAPAROTOMY N/A 09/22/2018   Procedure: Exploratory Laparotomy and Hysterotomy with removal of Ectopic Pregnancy;  Surgeon: [place] Bing, MD;  Location: [facility] OR;  Service: Gynecology;  Laterality: N/A;  . UNILATERAL SALPINGECTOMY Right 09/22/2018   Procedure: Unilateral Salpingectomy;  Surgeon:  [place] Bing, MD;  Location: [facility] OR;  Service: Gynecology;  Laterality: Right;    History reviewed. No pertinent family history.  Social History   Tobacco Use  . Smoking status: Never Smoker  . Smokeless tobacco: Never Used  Substance Use Topics  . Alcohol use: Not Currently  . Drug use: Not Currently    Allergies: No Known Allergies  Medications Prior to Admission  Medication Sig Dispense Refill Last Dose  . acetaminophen (TYLENOL) 325 MG tablet Take 2 tablets (650 mg total) by mouth every 4 (four) hours as needed for mild pain, moderate pain or fever. 30 tablet 0   . doxylamine, Sleep, (UNISOM) 25 MG tablet Take 1 tablet (25 mg total) by mouth every 8 (eight) hours as needed. (Patient not taking: Reported on 09/23/2018) 30 tablet 0   . gabapentin (NEURONTIN) 100 MG capsule Take 3 capsules (300 mg total) by mouth 2 (two) times daily as needed. Nerve pain 30 capsule 0   . HYDROmorphone (DILAUDID) 2 MG tablet Take 1 tablet (2 mg total) by mouth every 6 (six) hours as needed for severe pain. 20 tablet 0   . ibuprofen (ADVIL) 800 MG tablet Take 1 tablet (800 mg total) by mouth every 8 (eight) hours as needed. (Patient not taking: Reported on 10/09/2018) 30 tablet 0   . iron polysaccharides (NIFEREX) 150 MG  capsule Take 1 capsule (150 mg total) by mouth daily. (Patient not taking: Reported on 10/09/2018) 30 capsule 0   . polyethylene glycol (MIRALAX / GLYCOLAX) 17 g packet Take 17 g by mouth daily at 6 PM. (Patient not taking: Reported on 10/09/2018) 30 each 1   . pyridOXINE (VITAMIN B-6) 25 MG tablet Take 1 tablet (25 mg total) by mouth every 8 (eight) hours. (Patient not taking: Reported on 09/23/2018) 30 tablet 0   . simethicone (MYLICON) 80 MG chewable tablet Chew 1 tablet (80 mg total) by mouth 4 (four) times daily as needed for flatulence. (Patient not taking: Reported on 10/09/2018) 30 tablet 0     Review of Systems  Constitutional: Negative for chills and fever.  Gastrointestinal:  Negative for nausea and vomiting.  Genitourinary: Positive for pelvic pain and vaginal bleeding. Negative for dysuria, frequency and urgency.   Physical Exam   Temperature 98.8 F (37.1 C), temperature source Oral, resp. rate 16, height 5' 6.54" (1.69 m), weight 76.4 kg, SpO2 100 %, unknown if currently breastfeeding.  Physical Exam Vitals and nursing note reviewed.  Constitutional:      General: She is not in acute distress. HENT:     Head: Normocephalic.  Eyes:     Pupils: Pupils are equal, round, and reactive to light.  Cardiovascular:     Rate and Rhythm: Normal rate.  Pulmonary:     Effort: Pulmonary effort is normal.  Abdominal:     Palpations: Abdomen is soft.     Tenderness: There is no abdominal tenderness.  Skin:    General: Skin is warm and dry.  Neurological:     Mental Status: She is alert and oriented to person, place, and time.  Psychiatric:        Mood and Affect: Mood normal.        Behavior: Behavior normal.     MAU Course  Procedures Results for orders placed or performed during the hospital encounter of 03/18/20 (from the past 24 hour(s))  Pregnancy, urine POC     Status: Abnormal   Collection Time: 03/18/20  8:21 PM  Result Value Ref Range   Preg Test, Ur POSITIVE (A) NEGATIVE  Urinalysis, Routine w reflex microscopic Urine, Clean Catch     Status: Abnormal   Collection Time: 03/18/20  8:24 PM  Result Value Ref Range   Color, Urine YELLOW YELLOW   APPearance CLEAR CLEAR   Specific Gravity, Urine 1.017 1.005 - 1.030   pH 6.0 5.0 - 8.0   Glucose, UA NEGATIVE NEGATIVE mg/dL   Hgb urine dipstick SMALL (A) NEGATIVE   Bilirubin Urine NEGATIVE NEGATIVE   Ketones, ur NEGATIVE NEGATIVE mg/dL   Protein, ur NEGATIVE NEGATIVE mg/dL   Nitrite NEGATIVE NEGATIVE   Leukocytes,Ua NEGATIVE NEGATIVE   RBC / HPF 6-10 0 - 5 RBC/hpf   WBC, UA 0-5 0 - 5 WBC/hpf   Bacteria, UA RARE (A) NONE SEEN   Squamous Epithelial / LPF 0-5 0 - 5  CBC     Status: Abnormal    Collection Time: 03/18/20  8:44 PM  Result Value Ref Range   WBC 9.9 4.0 - 10.5 K/uL   RBC 4.06 3.87 - 5.11 MIL/uL   Hemoglobin 12.1 12.0 - 15.0 g/dL   HCT 68.6 (L) 16.8 - 37.2 %   MCV 87.7 80.0 - 100.0 fL   MCH 29.8 26.0 - 34.0 pg   MCHC 34.0 30.0 - 36.0 g/dL   RDW 90.2 11.1 - 55.2 %  Platelets 251 150 - 400 K/uL   nRBC 0.0 0.0 - 0.2 %  hCG, quantitative, pregnancy     Status: Abnormal   Collection Time: 03/18/20  8:44 PM  Result Value Ref Range   hCG, Beta Chain, Quant, S 11,421 (H) <5 mIU/mL   US OB LESS THAN 14 WEEKS WITH OB TRANSVAGINAL  Result Date: 03/18/2020 CLINICAL DATA:  Initial evaluation for acute pelvic pain, vaginal bleeding, early pregnancy. EXAM: OBSTETRIC <14 WK [place] AND TRANSVAGINAL OB US TECHNIQUE: Both transabdominal and transvaginal ultrasound examinations were performed for complete evaluation of the gestation as well as the maternal uterus, adnexal regions, and pelvic cul-de-sac. Transvaginal technique was performed to assess early pregnancy. COMPARISON:  None available. FINDINGS: Intrauterine gestational sac: Single Yolk sac:  Present Embryo:  Negative. Cardiac Activity: Negative. Heart Rate: N/A MSD: 8.7 mm   5 w   4 d Subchorionic hemorrhage: Small subchorionic hemorrhage measuring 1.8 x 0.6 x 2.2 cm without mass effect. Maternal uterus/adnexae: Ovaries within normal limits bilaterally. Corpus luteal cyst noted on the left. No adnexal mass or free fluid. IMPRESSION: 1. Early intrauterine gestational sac with internal yolk sac, but no fetal pole or cardiac activity yet visualized. Recommend follow-up quantitative B-HCG levels and follow-up US in 14 days to confirm and assess viability. 2. Small subchorionic hemorrhage without mass effect. 3. No other acute maternal uterine or adnexal abnormality. Electronically Signed   By: Rise Mu M.D.   On: 03/18/2020 21:43    MDM  [place] and blood work pending Care turned over to [employer], CNM   Thressa Sheller DNP,  CNM  03/18/20  9:12 PM     Assessment and Plan  Reassessment (10:05 PM) IUGS and [facility]  -Results as above -Informed of findings.  -Patient reassured extensively that pregnancy is within the uterus. -Patient tearful and expresses gratitude for the findings.  -Discussed need for follow up US in 2 weeks. -Order placed.  -Rx for PNV sent to pharmacy on file. -Educated on [facility], what to expect including bleeding, risks for miscarriage, and resolution.  -Bleeding precautions given. -Encouraged to call or return to MAU if symptoms worsen or with the onset of new symptoms. -Discharged to home in stable condition.  Cherre Robins MSN, CNM Advanced Practice Provider, [facility] [employer]

## 2020-03-21 ENCOUNTER — Inpatient Hospital Stay (HOSPITAL_COMMUNITY): Payer: Medicaid Other

## 2020-03-21 ENCOUNTER — Encounter (HOSPITAL_COMMUNITY): Payer: Self-pay | Admitting: Obstetrics and Gynecology

## 2020-03-21 ENCOUNTER — Other Ambulatory Visit: Payer: Self-pay

## 2020-03-21 ENCOUNTER — Inpatient Hospital Stay (HOSPITAL_COMMUNITY)
Admission: AD | Admit: 2020-03-21 | Discharge: 2020-03-21 | Disposition: A | Discharge status: Home / Self Care | Payer: Medicaid Other | Attending: Family Medicine | Admitting: Family Medicine

## 2020-03-21 DIAGNOSIS — O09291 Supervision of pregnancy with other poor reproductive or obstetric history, first trimester: Secondary | ICD-10-CM | POA: Diagnosis not present

## 2020-03-21 DIAGNOSIS — R102 Pelvic and perineal pain: Secondary | ICD-10-CM | POA: Insufficient documentation

## 2020-03-21 DIAGNOSIS — Z79899 Other long term (current) drug therapy: Secondary | ICD-10-CM | POA: Insufficient documentation

## 2020-03-21 DIAGNOSIS — O418X90 Other specified disorders of amniotic fluid and membranes, unspecified trimester, not applicable or unspecified: Secondary | ICD-10-CM | POA: Insufficient documentation

## 2020-03-21 DIAGNOSIS — Z3A01 Less than 8 weeks gestation of pregnancy: Secondary | ICD-10-CM | POA: Diagnosis not present

## 2020-03-21 DIAGNOSIS — O26891 Other specified pregnancy related conditions, first trimester: Secondary | ICD-10-CM | POA: Insufficient documentation

## 2020-03-21 DIAGNOSIS — Z3A00 Weeks of gestation of pregnancy not specified: Secondary | ICD-10-CM | POA: Diagnosis not present

## 2020-03-21 DIAGNOSIS — R1031 Right lower quadrant pain: Secondary | ICD-10-CM | POA: Insufficient documentation

## 2020-03-21 DIAGNOSIS — R1032 Left lower quadrant pain: Secondary | ICD-10-CM | POA: Insufficient documentation

## 2020-03-21 DIAGNOSIS — O468X1 Other antepartum hemorrhage, first trimester: Secondary | ICD-10-CM | POA: Diagnosis not present

## 2020-03-21 DIAGNOSIS — O209 Hemorrhage in early pregnancy, unspecified: Secondary | ICD-10-CM | POA: Diagnosis not present

## 2020-03-21 DIAGNOSIS — O208 Other hemorrhage in early pregnancy: Secondary | ICD-10-CM | POA: Diagnosis not present

## 2020-03-21 DIAGNOSIS — O468X9 Other antepartum hemorrhage, unspecified trimester: Secondary | ICD-10-CM

## 2020-03-21 LAB — CBC
HCT: 36.5 % (ref 36.0–46.0)
Hemoglobin: 12.2 g/dL (ref 12.0–15.0)
MCH: 29 pg (ref 26.0–34.0)
MCHC: 33.4 g/dL (ref 30.0–36.0)
MCV: 86.9 fL (ref 80.0–100.0)
Platelets: 243 10*3/uL (ref 150–400)
RBC: 4.2 MIL/uL (ref 3.87–5.11)
RDW: 12 % (ref 11.5–15.5)
WBC: 9.6 10*3/uL (ref 4.0–10.5)
nRBC: 0 % (ref 0.0–0.2)

## 2020-03-21 LAB — URINALYSIS, ROUTINE W REFLEX MICROSCOPIC
Bacteria, UA: NONE SEEN
Bilirubin Urine: NEGATIVE
Glucose, UA: NEGATIVE mg/dL
Ketones, ur: 5 mg/dL — AB
Leukocytes,Ua: NEGATIVE
Nitrite: NEGATIVE
Protein, ur: 30 mg/dL — AB
Specific Gravity, Urine: 1.031 — ABNORMAL HIGH (ref 1.005–1.030)
pH: 5 (ref 5.0–8.0)

## 2020-03-21 LAB — HCG, QUANTITATIVE, PREGNANCY: hCG, Beta Chain, Quant, S: 21353 m[IU]/mL — ABNORMAL HIGH (ref <5)

## 2020-03-21 NOTE — MAU Note (Signed)
..  Dawn Bullock is a 30 y.o. at [redacted]w[redacted]d here in MAU reporting: Vaginal bleeding and abdominal pain. Patient reports that yesterday she began to have a lot vaginal bleeding and abdominal pain it is better now but it is still there. Patient also reports dizziness that began yesterday. Denies any recent intercourse.  Pain score: 3/10 Vitals:   03/21/20 2002  BP: 115/69  Pulse: 93  Resp: 17  Temp: 98.7 F (37.1 C)    Lab orders placed from triage:  UA

## 2020-03-21 NOTE — MAU Provider Note (Signed)
Chief Complaint: Vaginal Bleeding and Abdominal Pain   Event Date/Time   First Provider Initiated Contact with Patient 03/21/20 2015        SUBJECTIVE HPI: Dawn Bullock is a 30 y.o. G3P1011 at [redacted]w[redacted]d by LMP who presents to maternity admissions reporting large amount of bleeding yesterday, less today.  Was seen here 03/18/20 for bleeding and cramping.  Was found to have a Single IUP with yolk sac and subchorionic hemorrhage that day. . She denies vaginal itching/burning, urinary symptoms, h/a, dizziness, n/v, or fever/chills.    Interpretor used.  Vaginal Bleeding The patient's primary symptoms include pelvic pain and vaginal bleeding. The patient's pertinent negatives include no genital itching, genital lesions or genital odor. This is a recurrent problem. The current episode started yesterday. The problem occurs intermittently. The problem has been gradually improving. The pain is mild. She is pregnant. Associated symptoms include abdominal pain. Pertinent negatives include no constipation, diarrhea, dysuria, fever or vomiting. The vaginal discharge was bloody. She has not been passing clots. She has not been passing tissue. Nothing aggravates the symptoms. She has tried nothing for the symptoms.  Abdominal Pain This is a recurrent problem. The current episode started yesterday. The onset quality is gradual. The pain is located in the LLQ, RLQ and suprapubic region. The quality of the pain is cramping. Pertinent negatives include no constipation, diarrhea, dysuria, fever or vomiting.    Past Medical History:  Diagnosis Date  . Hypotension   . Medical history non-contributory    Past Surgical History:  Procedure Laterality Date  . CESAREAN SECTION  01/2019  . EXPLORATORY LAPAROTOMY  09/22/2018   Exploratory Laparotomy and Hysterotomy with removal of Ectopic Pregnancy (N/A)  . LAPAROTOMY N/A 09/22/2018   Procedure: Exploratory Laparotomy and Hysterotomy with removal of Ectopic  Pregnancy;  Surgeon: [place] Bing, MD;  Location: [facility] OR;  Service: Gynecology;  Laterality: N/A;  . UNILATERAL SALPINGECTOMY Right 09/22/2018   Procedure: Unilateral Salpingectomy;  Surgeon: [place] Bing, MD;  Location: [facility] OR;  Service: Gynecology;  Laterality: Right;   Social History   Socioeconomic History  . Marital status: Married    Spouse name: Not on file  . Number of children: Not on file  . Years of education: Not on file  . Highest education level: Not on file  Occupational History  . Not on file  Tobacco Use  . Smoking status: Never Smoker  . Smokeless tobacco: Never Used  Substance and Sexual Activity  . Alcohol use: Not Currently  . Drug use: Not Currently  . Sexual activity: Not Currently    Comment: had sex 3 weeks ago  Other Topics Concern  . Not on file  Social History Narrative  . Not on file   Social Determinants of Health   Financial Resource Strain: Not on file  Food Insecurity: Not on file  Transportation Needs: Not on file  Physical Activity: Not on file  Stress: Not on file  Social Connections: Not on file  Intimate Partner Violence: Not on file   No current facility-administered medications on file prior to encounter.   Current Outpatient Medications on File Prior to Encounter  Medication Sig Dispense Refill  . acetaminophen (TYLENOL) 325 MG tablet Take 2 tablets (650 mg total) by mouth every 4 (four) hours as needed for mild pain, moderate pain or fever. 30 tablet 0  . doxylamine, Sleep, (UNISOM) 25 MG tablet Take 1 tablet (25 mg total) by mouth every 8 (eight) hours as needed. (Patient not taking:  Reported on 09/23/2018) 30 tablet 0  . polyethylene glycol (MIRALAX / GLYCOLAX) 17 g packet Take 17 g by mouth daily at 6 PM. (Patient not taking: Reported on 10/09/2018) 30 each 1  . Prenatal Vit-Fe Fumarate-FA (PREPLUS) 27-1 MG TABS Take 1 tablet by mouth daily. 30 tablet 13  . pyridOXINE (VITAMIN B-6) 25 MG tablet Take 1 tablet (25 mg total) by  mouth every 8 (eight) hours. (Patient not taking: Reported on 09/23/2018) 30 tablet 0   No Known Allergies  I have reviewed patient's Past Medical Hx, Surgical Hx, Family Hx, Social Hx, medications and allergies.   ROS:  Review of Systems  Constitutional: Negative for fever.  Gastrointestinal: Positive for abdominal pain. Negative for constipation, diarrhea and vomiting.  Genitourinary: Positive for pelvic pain and vaginal bleeding. Negative for dysuria.   Review of Systems  Other systems negative   Physical Exam  Physical Exam Patient Vitals for the past 24 hrs:  BP Temp Temp src Pulse Resp  03/21/20 2002 115/69 98.7 F (37.1 C) Oral 93 17   Constitutional: Well-developed, well-nourished female in no acute distress.  Cardiovascular: normal rate Respiratory: normal effort GI: Abd soft, non-tender. Pos BS x 4 MS: Extremities nontender, no edema, normal ROM Neurologic: Alert and oriented x 4.  GU: Neg CVAT.  PELVIC EXAM: Cervix pink, visually closed, without lesion, moderate clotted dark blood in vault Bimanual exam: Cervix 0/long/high, firm, anterior, neg CMT, uterus nontender, nonenlarged, adnexa without tenderness, enlargement, or mass   LAB RESULTS Results for orders placed or performed during the hospital encounter of 03/21/20 (from the past 24 hour(s))  CBC     Status: None   Collection Time: 03/21/20  7:37 PM  Result Value Ref Range   WBC 9.6 4.0 - 10.5 K/uL   RBC 4.20 3.87 - 5.11 MIL/uL   Hemoglobin 12.2 12.0 - 15.0 g/dL   HCT 09.9 83.3 - 82.5 %   MCV 86.9 80.0 - 100.0 fL   MCH 29.0 26.0 - 34.0 pg   MCHC 33.4 30.0 - 36.0 g/dL   RDW 05.3 97.6 - 73.4 %   Platelets 243 150 - 400 K/uL   nRBC 0.0 0.0 - 0.2 %  hCG, quantitative, pregnancy     Status: Abnormal   Collection Time: 03/21/20  7:37 PM  Result Value Ref Range   hCG, Beta Chain, Quant, S 21,353 (H) <5 mIU/mL  Urinalysis, Routine w reflex microscopic Urine, Clean Catch     Status: Abnormal   Collection  Time: 03/21/20  8:04 PM  Result Value Ref Range   Color, Urine YELLOW YELLOW   APPearance HAZY (A) CLEAR   Specific Gravity, Urine 1.031 (H) 1.005 - 1.030   pH 5.0 5.0 - 8.0   Glucose, UA NEGATIVE NEGATIVE mg/dL   Hgb urine dipstick MODERATE (A) NEGATIVE   Bilirubin Urine NEGATIVE NEGATIVE   Ketones, ur 5 (A) NEGATIVE mg/dL   Protein, ur 30 (A) NEGATIVE mg/dL   Nitrite NEGATIVE NEGATIVE   Leukocytes,Ua NEGATIVE NEGATIVE   RBC / HPF 11-20 0 - 5 RBC/hpf   WBC, UA 0-5 0 - 5 WBC/hpf   Bacteria, UA NONE SEEN NONE SEEN   Squamous Epithelial / LPF 0-5 0 - 5   Mucus PRESENT         IMAGING US OB Transvaginal  Result Date: 03/21/2020 CLINICAL DATA:  Increased vaginal bleeding in this 30 year old female with history of intrauterine pregnancy without visible embryo but with yolk sac. EXAM: OBSTETRIC <14 WK [place]  AND TRANSVAGINAL OB US TECHNIQUE: Both transabdominal and transvaginal ultrasound examinations were performed for complete evaluation of the gestation as well as the maternal uterus, adnexal regions, and pelvic cul-de-sac. Transvaginal technique was performed to assess early pregnancy. COMPARISON:  Ultrasound evaluation from March 18, 2020 FINDINGS: Intrauterine gestational sac: Single Yolk sac:  Visualized. Embryo:  Not Visualized. MSD: 12.4 mm   6 w   0 d Subchorionic hemorrhage: Subchorionic hemorrhage moderate size with some blood products extending into the endometrial canal. Transverse dimension may be slightly larger than on the prior study. Maternal uterus/adnexae: The ovaries are not visualized. No adnexal masses. No free fluid IMPRESSION: Slight increase in size of the gestational sac without visible embryo at this time but with internal yolk sac. Continued follow-up quantitative beta HCG levels and ultrasound in 14 days or as clinically warranted for further assessment is suggested. Slight interval increase in size of small moderate subchorionic hemorrhage with some blood in the  endometrial canal. Attention on follow-up. Electronically Signed   By: Donzetta Kohut M.D.   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  The process as listed above helps to determine which of these is present.  Reviewed findings with patient She is still worried this will turn into an ectopic and she will need surgery Reassured that while we cannot know if this will be a viable pregnancy or a miscarriage, it will not be an ectopic pregnancy.  Will need f/u US 2 wks   ASSESSMENT Pregnancy at [redacted]w[redacted]d, Gestational sac with  Yolk sac Moderate subchorionic hemorrhage  PLAN Discharge home Plan to repeat Ultrasound in about 10-14 days  Bleeding precautions Encouraged to call and schedule prenatal visit  Pt stable at time of discharge. Encouraged to return here if she develops worsening of symptoms, increase in pain, fever, or other concerning symptoms.    Wynelle Bourgeois CNM, MSN Certified Nurse-Midwife 03/21/2020  8:23 PM

## 2020-03-21 NOTE — Discharge Instructions (Signed)
Vaginal Bleeding During Pregnancy, First Trimester A small amount of bleeding from the vagina, or spotting, is common during early pregnancy. Some bleeding may be related to the pregnancy, and some may not. In many cases, the bleeding is normal and is not a problem. However, bleeding can also be a sign of something serious. Normal things that may cause bleeding during the first trimester:  Implantation of the fertilized egg in the lining of the uterus.  Rapid changes in blood vessels. This is caused by changes that are happening to the body during pregnancy.  Sex.  Pelvic exams. Abnormal things that may cause bleeding during the first trimester include:  Infection or inflammation of the cervix.  Growths or polyps on the cervix.  Miscarriage or threatened miscarriage.  Pregnancy that is growing outside of the uterus (ectopic pregnancy).  A fertilized egg that becomes a mass of tissue (molar pregnancy). Tell your health care provider right away if there is any bleeding from your vagina. Follow these instructions at home: Monitoring your bleeding Monitor your bleeding.  Pay attention to any changes in your symptoms. Let your health care provider know about any concerns.  Try to understand when the bleeding occurs. Does the bleeding start on its own, or does it start after something is done, such as sex or a pelvic exam?  Use a diary to record the things you see about your bleeding, including: ? The kind of bleeding you are having. Does the bleeding start and stop irregularly, or is it a constant flow? ? The severity of your bleeding. Is the bleeding heavy or light? ? The number of pads you use each day, how often you change them, and how soaked they are.  Tell your health care provider if you pass tissue. He or she may want to see it.   Activity  Follow instructions from your health care provider about limiting your activity. Ask what activities are safe for you.  Do not have  sex until your health care provider says that this is safe.  If needed, make plans for someone to help with your regular activities. General instructions  Take over-the-counter and prescription medicines only as told by your health care provider.  Do not take aspirin because it can cause bleeding.  Do not use tampons or douche.  Keep all follow-up visits. This is important. Contact a health care provider if:  You have vaginal bleeding during any part of your pregnancy.  You have cramps or labor pains.  You have a fever or chills. Get help right away if:  You have severe cramps in your back or abdomen.  You pass large clots or a large amount of tissue from your vagina.  Your bleeding increases.  You feel light-headed or weak, or you faint.  You are leaking fluid or have a gush of fluid from your vagina. Summary  A small amount of bleeding from the vagina is common during early pregnancy.  Be sure to tell your health care provider about any vaginal bleeding right away.  Try to understand when bleeding occurs. Does bleeding occur on its own, or does it occur after something is done, such as sex or pelvic exams?  Keep all follow-up visits. This is important. This information is not intended to replace advice given to you by your health care provider. Make sure you discuss any questions you have with your health care provider. Document Revised: 11/18/2019 Document Reviewed: 11/18/2019 Elsevier Patient Education  2021 [employer]  

## 2020-03-30 ENCOUNTER — Ambulatory Visit: Payer: Self-pay | Admitting: *Deleted

## 2020-03-30 NOTE — Telephone Encounter (Signed)
Patient's husband calling with patient present translating. C/o vomiting multiple times since awakening today and had 1 episode of diarrhea.vomiting reported since last week. Seen in ED for vomiting and patient has not been able to eat or drink. Reports feeling hungry but can not eat or she will vomit. Is [redacted] weeks pregnant. C/o abdominal pain in upper middle of abdomen. Reports sour taste in mouth. Denies dizziness , headache, lightheadedness, weakness standing. Patient's husband requesting medication for patient. Instructed patient's husband to call pharmacist for recommendations for medication OTC.new patient appt scheduled for 04/03/20 per patient's husband.  If patient continues to c/o abdominal pain and vomiting go to UC or ED. Care advise given. Patient's husband verbalized understanding of care  Advise and to go to [facility] or ED if symptoms persist or worsen.   Reason for Disposition . [1] MILD or MODERATE vomiting AND [2] present > 48 hours (2 days) (Exception: mild vomiting with associated diarrhea)  Answer Assessment - Initial Assessment Questions 1. VOMITING SEVERITY: "How many times have you vomited in the past 24 hours?"     - MILD:  1 - 2 times/day    - MODERATE: 3 - 5 times/day, decreased oral intake without significant weight loss or symptoms of dehydration    - SEVERE: 6 or more times/day, vomits everything or nearly everything, with significant weight loss, symptoms of dehydration      Since she was awake this am on and off. Patient is [redacted] weeks pregnant 2. ONSET: "When did the vomiting begin?"      1 week ago  3. FLUIDS: "What fluids or food have you vomited up today?" "Have you been able to keep any fluids down?"     Water and unable to keep fluids down 4. ABDOMINAL PAIN: "Are your having any abdominal pain?" If yes : "How bad is it and what does it feel like?" (e.g., crampy, dull, intermittent, constant)      Yes upper middle abdomen, constant , feels hungry but cannot eat. 5. DIARRHEA:  "Is there any diarrhea?" If Yes, ask: "How many times today?"      Yes x 1 today  6. CONTACTS: "Is there anyone else in the family with the same symptoms?"      No  7. CAUSE: "What do you think is causing your vomiting?"     Unknown  8. HYDRATION STATUS: "Any signs of dehydration?" (e.g., dry mouth [not only dry lips], too weak to stand) "When did you last urinate?"     No  9. OTHER SYMPTOMS: "Do you have any other symptoms?" (e.g., fever, headache, vertigo, vomiting blood or coffee grounds, recent head injury)     No  10. PREGNANCY: "Is there any chance you are pregnant?" "When was your last menstrual period?"       Yes [redacted] weeks pregnant  Protocols used: [facility]

## 2020-04-03 ENCOUNTER — Telehealth: Payer: Self-pay | Admitting: Medical

## 2020-04-03 ENCOUNTER — Other Ambulatory Visit: Payer: Self-pay

## 2020-04-03 ENCOUNTER — Ambulatory Visit
Admission: RE | Admit: 2020-04-03 | Discharge: 2020-04-03 | Disposition: A | Discharge status: Home / Self Care | Payer: Medicaid Other | Source: Ambulatory Visit | Attending: Advanced Practice Midwife | Admitting: Advanced Practice Midwife

## 2020-04-03 DIAGNOSIS — O468X9 Other antepartum hemorrhage, unspecified trimester: Secondary | ICD-10-CM | POA: Insufficient documentation

## 2020-04-03 DIAGNOSIS — O209 Hemorrhage in early pregnancy, unspecified: Secondary | ICD-10-CM | POA: Insufficient documentation

## 2020-04-03 DIAGNOSIS — O418X90 Other specified disorders of amniotic fluid and membranes, unspecified trimester, not applicable or unspecified: Secondary | ICD-10-CM | POA: Diagnosis not present

## 2020-04-03 DIAGNOSIS — O208 Other hemorrhage in early pregnancy: Secondary | ICD-10-CM | POA: Diagnosis not present

## 2020-04-03 DIAGNOSIS — Z3A01 Less than 8 weeks gestation of pregnancy: Secondary | ICD-10-CM | POA: Diagnosis not present

## 2020-04-03 NOTE — Telephone Encounter (Signed)
I called Dawn Bullock today at 11:49 AM using an interpreter and confirmed patient's identity using two patient identifiers. [place] results from earlier today were reviewed. Patient is scheduled for new OB visit at [facility] on 04/26/20. First trimester and bleeding precautions reviewed. Patient voiced understanding and had no further questions.   US OB Transvaginal  Result Date: 04/03/2020 CLINICAL DATA:  Moderate subchorionic hemorrhage EXAM: OBSTETRIC <14 WK [place] AND TRANSVAGINAL OB US TECHNIQUE: Both transabdominal and transvaginal ultrasound examinations were performed for complete evaluation of the gestation as well as the maternal uterus, adnexal regions, and pelvic cul-de-sac. Transvaginal technique was performed to assess early pregnancy. COMPARISON:  03/21/2020 FINDINGS: Intrauterine gestational sac: Single Yolk sac:  Visualized Embryo:  Visualized. Cardiac Activity: Visualized. Heart Rate: 154 bpm CRL: 13.3  mm   7 w   4 d                  [place] EDC: 11/16/2020 Subchorionic hemorrhage: Small subchorionic hemorrhage along the left lateral aspect measuring 1.5 x 1 cm. Maternal uterus/adnexae: Left corpus luteum cyst. Ovaries are otherwise normal. No pelvic free fluid. IMPRESSION: 1. Single live intrauterine pregnancy as detailed above. 2. Small subchorionic hemorrhage along the left lateral aspect measuring 1.5 x 1 cm. Electronically Signed   By: Elige Ko   On: 04/03/2020 10:48    Vonzella Nipple, PA-C 04/03/2020 11:49 AM

## 2020-04-26 ENCOUNTER — Ambulatory Visit (INDEPENDENT_AMBULATORY_CARE_PROVIDER_SITE_OTHER): Payer: Medicaid Other | Admitting: Obstetrics and Gynecology

## 2020-04-26 ENCOUNTER — Other Ambulatory Visit (HOSPITAL_COMMUNITY)
Admission: RE | Admit: 2020-04-26 | Discharge: 2020-04-26 | Disposition: A | Discharge status: Home / Self Care | Payer: Medicaid Other | Source: Ambulatory Visit | Attending: Obstetrics and Gynecology | Admitting: Obstetrics and Gynecology

## 2020-04-26 ENCOUNTER — Other Ambulatory Visit: Payer: Self-pay

## 2020-04-26 ENCOUNTER — Other Ambulatory Visit (HOSPITAL_COMMUNITY): Payer: Self-pay | Admitting: Obstetrics and Gynecology

## 2020-04-26 ENCOUNTER — Encounter: Payer: Self-pay | Admitting: Obstetrics and Gynecology

## 2020-04-26 ENCOUNTER — Other Ambulatory Visit: Payer: Self-pay | Admitting: Obstetrics and Gynecology

## 2020-04-26 VITALS — BP 102/67 | HR 93 | Temp 98.8°F | Wt 156.0 lb

## 2020-04-26 DIAGNOSIS — Z3480 Encounter for supervision of other normal pregnancy, unspecified trimester: Secondary | ICD-10-CM | POA: Diagnosis not present

## 2020-04-26 DIAGNOSIS — Z3A10 10 weeks gestation of pregnancy: Secondary | ICD-10-CM | POA: Diagnosis not present

## 2020-04-26 DIAGNOSIS — O2690 Pregnancy related conditions, unspecified, unspecified trimester: Secondary | ICD-10-CM | POA: Insufficient documentation

## 2020-04-26 DIAGNOSIS — O26891 Other specified pregnancy related conditions, first trimester: Secondary | ICD-10-CM | POA: Diagnosis not present

## 2020-04-26 DIAGNOSIS — Z603 Acculturation difficulty: Secondary | ICD-10-CM

## 2020-04-26 DIAGNOSIS — Z7189 Other specified counseling: Secondary | ICD-10-CM

## 2020-04-26 DIAGNOSIS — R110 Nausea: Secondary | ICD-10-CM | POA: Diagnosis not present

## 2020-04-26 MED ORDER — DOXYLAMINE-PYRIDOXINE 10-10 MG PO TBEC: 2.0000 | DELAYED_RELEASE_TABLET | Freq: Every day | ORAL | 5 refills | Status: DC

## 2020-04-26 MED ORDER — FAMOTIDINE 20 MG PO TABS: 20.0000 mg | ORAL_TABLET | Freq: Every day | ORAL | 3 refills | Status: DC

## 2020-04-26 MED FILL — DICLEGIS DR 10-10 MG TABLET: 10-10 | 25 days supply | Qty: 100 | Fill #0

## 2020-04-26 MED FILL — FAMOTIDINE 20 MG TABLET: 20 | 60 days supply | Qty: 60 | Fill #0

## 2020-04-26 NOTE — Progress Notes (Signed)
le  INITIAL PRENATAL VISIT NOTE  Subjective:  Dawn Bullock is a 30 y.o. G3P1011 at [redacted]w[redacted]d by LMP being seen today for her initial prenatal visit. 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 She has a medical history significant for n/a.  Patient reports significant nausea/vomiting.. With weight loss.  Contractions: Not present. Vag. Bleeding: None.  Movement: Absent. Denies leaking of fluid.    Past Medical History:  Diagnosis Date  . Hypotension   . Medical history non-contributory     Past Surgical History:  Procedure Laterality Date  . CESAREAN SECTION  01/2019  . EXPLORATORY LAPAROTOMY  09/22/2018   Exploratory Laparotomy and Hysterotomy with removal of Ectopic Pregnancy (N/A)  . LAPAROTOMY N/A 09/22/2018   Procedure: Exploratory Laparotomy and Hysterotomy with removal of Ectopic Pregnancy;  Surgeon: [place] Bing, MD;  Location: [facility] OR;  Service: Gynecology;  Laterality: N/A;  . UNILATERAL SALPINGECTOMY Right 09/22/2018   Procedure: Unilateral Salpingectomy;  Surgeon: [place] Bing, MD;  Location: [facility] OR;  Service: Gynecology;  Laterality: Right;    OB History  Gravida Para Term Preterm AB Living  3 1 1   1 1   SAB IAB Ectopic Multiple Live Births      1   1    # Outcome Date GA Lbr Len/2nd Weight Sex Delivery Anes PTL Lv  3 Current           2 Ectopic 09/22/18 [redacted]w[redacted]d    ECTOPIC     1 Term 2016     Vag-Spont   LIV    Social History   Socioeconomic History  . Marital status: Married    Spouse name: Not on file  . Number of children: Not on file  . Years of education: Not on file  . Highest education level: Not on file  Occupational History  . Not on file  Tobacco Use  . Smoking status: Never Smoker  . Smokeless tobacco: Never Used  Substance and Sexual Activity  . Alcohol use: Not Currently  . Drug use: Not Currently  . Sexual activity:  Yes    Comment: had sex 3 weeks ago  Other Topics Concern  . Not on file  Social History Narrative  . Not on file   Social Determinants of Health   Financial Resource Strain: Not on file  Food Insecurity: Not on file  Transportation Needs: Not on file  Physical Activity: Not on file  Stress: Not on file  Social Connections: Not on file    History reviewed. No pertinent family history.   Current Outpatient Medications:  .  Doxylamine-Pyridoxine (DICLEGIS) 10-10 MG TBEC, Take 2 tablets by mouth at bedtime. If symptoms persist, add one tablet in the morning and one in the afternoon, Disp: 100 tablet, Rfl: 5 .  famotidine (PEPCID) 20 MG tablet, Take 1 tablet (20 mg total) by mouth daily., Disp: 60 tablet, Rfl: 3 .  acetaminophen (TYLENOL) 325 MG tablet, Take 2 tablets (650 mg total) by mouth every 4 (four) hours as needed for mild pain, moderate pain or fever. (Patient not taking: Reported on 04/26/2020), Disp: 30 tablet, Rfl: 0 .  doxylamine, Sleep, (UNISOM) 25 MG tablet, Take 1 tablet (25 mg total) by mouth every 8 (eight) hours as needed. (Patient not taking: No sig reported), Disp: 30 tablet, Rfl: 0 .  polyethylene glycol (MIRALAX / GLYCOLAX) 17 g packet, Take 17 g by mouth daily  at 6 PM. (Patient not taking: No sig reported), Disp: 30 each, Rfl: 1 .  Prenatal Vit-Fe Fumarate-FA (PREPLUS) 27-1 MG TABS, Take 1 tablet by mouth daily. (Patient not taking: Reported on 04/26/2020), Disp: 30 tablet, Rfl: 13 .  pyridOXINE (VITAMIN B-6) 25 MG tablet, Take 1 tablet (25 mg total) by mouth every 8 (eight) hours. (Patient not taking: No sig reported), Disp: 30 tablet, Rfl: 0  No Known Allergies  Review of Systems: Negative except for what is mentioned in HPI.  Objective:   Vitals:   04/26/20 1528  BP: 102/67  Pulse: 93  Temp: 98.8 F (37.1 C)  Weight: 156 lb (70.8 kg)    Fetal Status:     Movement: Absent     Physical Exam: BP 102/67   Pulse 93   Temp 98.8 F (37.1 C)   Wt 156 lb  (70.8 kg)   LMP 02/12/2020   BMI 24.78 kg/m  CONSTITUTIONAL: Well-developed, well-nourished female in no acute distress.  NEUROLOGIC: Alert and oriented to person, place, and time. Normal reflexes, muscle tone coordination. No cranial nerve deficit noted. PSYCHIATRIC: Normal mood and affect. Normal behavior. Normal judgment and thought content. SKIN: Skin is warm and dry. No rash noted. Not diaphoretic. No erythema. No pallor. HENT:  Normocephalic, atraumatic, External right and left ear normal. Oropharynx is clear and moist EYES: Conjunctivae and EOM are normal. Pupils are equal, round, and reactive to light. No scleral icterus.  NECK: Normal range of motion, supple, no masses CARDIOVASCULAR: Normal heart rate noted, regular rhythm RESPIRATORY: Effort and breath sounds normal, no problems with respiration noted BREASTS: symmetric, non-tender, no masses palpable ABDOMEN: Soft, nontender, nondistended, gravid. GU: normal appearing external female genitalia, multiparous normal appearing cervix, scant white discharge in vagina, no lesions noted Bimanual: 10 weeks sized uterus, no adnexal tenderness or palpable lesions noted MUSCULOSKELETAL: Normal range of motion. EXT:  No edema and no tenderness. 2+ distal pulses.   Assessment and Plan:  Pregnancy: G3P1011 at [redacted]w[redacted]d by LMP  1. Supervision of other normal pregnancy, antepartum - Declines genetic testing - CBC/D/Plt+RPR+Rh+ABO+Rub Ab... - Hemoglobpathy+Fer w/A Thal Rfx - Culture, OB Urine - [place] MFM OB COMP + 14 WK; Future - Cytology - PAP( [place])  4. Nausea diclegis and pepcid sent  2. Language barrier, cultural differences Arabic interpretor used  3. Abnormal pregnancy, antepartum H/o interstitial pregnancy with right salpingectomy - will need CS at 37 weeks, pt aware  5. Counseled about COVID-19 virus infection COVID-19 Vaccine Counseling: The patient was counseled on the potential benefits and lack of known risks of  COVID vaccination, during pregnancy and breastfeeding, during today's visit. The patient's questions and concerns were addressed today, including safety of the vaccination and potential side effects as they have been published by ACOG and [facility]. The patient has been informed that there have not been any documented vaccine related injuries, deaths or birth defects to infant or mom after receiving the COVID-19 vaccine to date. The patient has been made aware that although she is not at increased risk of contracting COVID-19 during pregnancy, she is at increased risk of developing severe disease and complications if she contracts COVID-19 while pregnant. All patient questions were addressed during our visit today. The patient is still unsure of her decision for vaccination, thinks she will but does not want to do it until she is feeling better with her nausea.   Preterm labor symptoms and general obstetric precautions including but not limited to vaginal bleeding, contractions, leaking  of fluid and fetal movement were reviewed in detail with the patient.  Please refer to After Visit Summary for other counseling recommendations.   Return in about 4 weeks (around 05/24/2020) for high OB, in person.  Conan Bowens 04/26/2020 4:26 PM

## 2020-04-26 NOTE — Progress Notes (Signed)
DATING AND VIABILITY SONOGRAM   Dawn Bullock is a 30 y.o. year old G49P1011 with LMP Patient's last menstrual period was 02/12/2020. which would correlate to  [redacted]w[redacted]d weeks gestation.  She has regular menstrual cycles.   She is here today for a confirmatory initial sonogram.    GESTATION: SINGLETON     FETAL ACTIVITY:          Heart rate         176 bpm          The fetus is active  GESTATIONAL AGE AND  BIOMETRICS:  Gestational criteria: Estimated Date of Delivery: 11/18/20 by early ultrasound now at [redacted]w[redacted]d  Previous Scans:1      CROWN RUMP LENGTH           3.33 cm         10-2 weeks                                                                               AVERAGE EGA(BY THIS SCAN):  10-2 weeks  WORKING EDD( early ultrasound ):  11-18-2020     TECHNICIAN COMMENTS: Patient informed that the ultrasound is considered a limited obstetric ultrasound and is not intended to be a complete ultrasound exam. Patient also informed that the ultrasound is not being completed with the intent of assessing for fetal or placental anomalies or any pelvic abnormalities. Explained that the purpose of today's ultrasound is to assess for fetal heart rate. Patient acknowledges the purpose of the exam and the limitations of the study.     Armandina Stammer 04/26/2020 3:54 PM

## 2020-04-28 LAB — CBC/D/PLT+RPR+RH+ABO+RUB AB...
Antibody Screen: NEGATIVE
Basophils Absolute: 0 10*3/uL (ref 0.0–0.2)
Basos: 0 %
EOS (ABSOLUTE): 0 10*3/uL (ref 0.0–0.4)
Eos: 0 %
HCV Ab: <0.1 s/co ratio (ref 0.0–0.9)
HIV Screen 4th Generation wRfx: NONREACTIVE
Hematocrit: 36 % (ref 34.0–46.6)
Hemoglobin: 12.8 g/dL (ref 11.1–15.9)
Hepatitis B Surface Ag: NEGATIVE
Immature Grans (Abs): 0 10*3/uL (ref 0.0–0.1)
Immature Granulocytes: 0 %
Lymphocytes Absolute: 1.7 10*3/uL (ref 0.7–3.1)
Lymphs: 20 %
MCH: 30.1 pg (ref 26.6–33.0)
MCHC: 35.6 g/dL (ref 31.5–35.7)
MCV: 85 fL (ref 79–97)
Monocytes Absolute: 0.5 10*3/uL (ref 0.1–0.9)
Monocytes: 6 %
Neutrophils Absolute: 6.2 10*3/uL (ref 1.4–7.0)
Neutrophils: 74 %
Platelets: 224 10*3/uL (ref 150–450)
RBC: 4.25 x10E6/uL (ref 3.77–5.28)
RDW: 13 % (ref 11.7–15.4)
RPR Ser Ql: NONREACTIVE
Rh Factor: POSITIVE
Rubella Antibodies, IGG: <0.9 index — ABNORMAL LOW (ref >0.99)
WBC: 8.5 10*3/uL (ref 3.4–10.8)

## 2020-04-28 LAB — HEMOGLOBPATHY+FER W/A THAL RFX
Ferritin: 122 ng/mL (ref 15–150)
Hgb A2: 2.8 % (ref 1.8–3.2)
Hgb A: 97.2 % (ref 96.4–98.8)
Hgb F: 0 % (ref 0.0–2.0)
Hgb S: 0 %

## 2020-04-28 LAB — URINE CULTURE, OB REFLEX

## 2020-04-28 LAB — HCV INTERPRETATION

## 2020-04-28 LAB — CULTURE, OB URINE

## 2020-05-01 ENCOUNTER — Encounter: Payer: Self-pay | Admitting: Obstetrics and Gynecology

## 2020-05-01 DIAGNOSIS — Z2839 Other underimmunization status: Secondary | ICD-10-CM | POA: Insufficient documentation

## 2020-05-01 DIAGNOSIS — O99891 Other specified diseases and conditions complicating pregnancy: Secondary | ICD-10-CM | POA: Insufficient documentation

## 2020-05-01 LAB — CYTOLOGY - PAP
Chlamydia: NEGATIVE
Comment: NEGATIVE
Comment: NEGATIVE
Comment: NORMAL
Diagnosis: UNDETERMINED — AB
High risk HPV: NEGATIVE
Neisseria Gonorrhea: NEGATIVE

## 2020-05-02 ENCOUNTER — Other Ambulatory Visit (HOSPITAL_COMMUNITY): Payer: Self-pay | Admitting: Advanced Practice Midwife

## 2020-05-02 ENCOUNTER — Telehealth: Payer: Self-pay

## 2020-05-02 ENCOUNTER — Other Ambulatory Visit: Payer: Self-pay

## 2020-05-02 DIAGNOSIS — O219 Vomiting of pregnancy, unspecified: Secondary | ICD-10-CM

## 2020-05-02 MED ORDER — PROMETHAZINE HCL 25 MG PO TABS: 25.0000 mg | ORAL_TABLET | Freq: Four times a day (QID) | ORAL | 1 refills | Status: DC | PRN

## 2020-05-02 MED ORDER — PRENATE 0.6-0.4 MG PO CHEW: 1.0000 | CHEWABLE_TABLET | Freq: Every day | ORAL | 12 refills | Status: DC

## 2020-05-02 MED FILL — PRENATE CHEWABLE TABLET: 0.6-0.4 | 30 days supply | Qty: 30 | Fill #0

## 2020-05-02 MED FILL — PROMETHAZINE 25 MG TABLET: 25 | 7 days supply | Qty: 30 | Fill #0

## 2020-05-02 NOTE — Telephone Encounter (Signed)
Pt's husband called requesting New OB lab results. Pt's husband made aware that per Dr. Rosana Hoes, all New OB labs were normal but she will need a MMR vaccine PP. Understanding was voiced. Pt's husband also states Diclegis is not providing relief for pt's nausea and vomiting. A Rx for Phenergan was sent to the pt's pharmacy.  Luticia Tadros l Lanetta Figuero, CMA

## 2020-05-09 ENCOUNTER — Telehealth: Payer: Self-pay

## 2020-05-09 MED ORDER — ONDANSETRON 4 MG PO TBDP: 4.0000 mg | ORAL_TABLET | Freq: Four times a day (QID) | ORAL | 3 refills | Status: DC | PRN

## 2020-05-09 NOTE — Telephone Encounter (Signed)
Patient's husband called and stating that she is still having nausea and vomiting. Patients husband said the first medication she was given (diclegis) worked for three days and then stopped working.  Patient has tried phenergan and has not had any relief while taking this medication.  Will route to provider for any input on another medication they could try.   Encouraged patient's husband to take her to MAU for excessive vomiting especially if she cant keep down fluids (entrance C at [facility]) and he states he thinks she is able to keep some fluids down and would like the doctor to send in another medication for her.   Armandina Stammer RN

## 2020-05-09 NOTE — Telephone Encounter (Signed)
Attempted to reach patient to notify about Zofran called into her pharmacy. Voicemailbox not set up. Armandina Stammer RN

## 2020-05-09 NOTE — Telephone Encounter (Signed)
zofran called in.

## 2020-05-09 NOTE — Addendum Note (Signed)
Addended by: Levie Heritage on: 05/09/2020 03:37 PM   Modules accepted: Orders

## 2020-05-18 ENCOUNTER — Telehealth: Payer: Self-pay

## 2020-05-18 NOTE — Telephone Encounter (Signed)
Called patient through [facility] ID [phone] - attempted to call patient to move Anatomy ultrasound from Monday 06/26/20 due to ultrasound tech. staff shortage.  No Answer and No Voice Mail Will try to call patient back another time

## 2020-05-24 ENCOUNTER — Other Ambulatory Visit: Payer: Self-pay

## 2020-05-24 ENCOUNTER — Ambulatory Visit (INDEPENDENT_AMBULATORY_CARE_PROVIDER_SITE_OTHER): Payer: Medicaid Other | Admitting: Family Medicine

## 2020-05-24 ENCOUNTER — Telehealth: Payer: Self-pay

## 2020-05-24 VITALS — BP 111/66 | Wt 154.0 lb

## 2020-05-24 DIAGNOSIS — O219 Vomiting of pregnancy, unspecified: Secondary | ICD-10-CM

## 2020-05-24 DIAGNOSIS — Z283 Underimmunization status: Secondary | ICD-10-CM

## 2020-05-24 DIAGNOSIS — O99891 Other specified diseases and conditions complicating pregnancy: Secondary | ICD-10-CM

## 2020-05-24 DIAGNOSIS — Z2839 Other underimmunization status: Secondary | ICD-10-CM

## 2020-05-24 DIAGNOSIS — Z3480 Encounter for supervision of other normal pregnancy, unspecified trimester: Secondary | ICD-10-CM

## 2020-05-24 DIAGNOSIS — Z603 Acculturation difficulty: Secondary | ICD-10-CM

## 2020-05-24 DIAGNOSIS — Z3A14 14 weeks gestation of pregnancy: Secondary | ICD-10-CM

## 2020-05-24 MED ORDER — CONCEPT OB 130-92.4-1 MG PO CAPS: 1.0000 | ORAL_CAPSULE | Freq: Every day | ORAL | 11 refills | Status: DC

## 2020-05-24 MED ORDER — FOLIVANE-OB 85-1 MG PO CAPS: 1.0000 | ORAL_CAPSULE | Freq: Every day | ORAL | 2 refills | Status: DC

## 2020-05-24 MED ORDER — SCOPOLAMINE 1 MG/3DAYS TD PT72: 1.0000 | MEDICATED_PATCH | TRANSDERMAL | 12 refills | Status: DC

## 2020-05-24 NOTE — Patient Instructions (Signed)

## 2020-05-24 NOTE — Progress Notes (Signed)
   PRENATAL VISIT NOTE  Subjective:  Dawn Bullock is a 30 y.o. G3P1011 at 59w4dbeing seen today for ongoing prenatal care.  She is currently monitored for the following issues for this high-risk pregnancy and has Language barrier, cultural differences; Supervision of other normal pregnancy, antepartum; and Rubella non-immune status, antepartum on their problem list.  Patient reports nausea and vomiting.  Contractions: Not present. Vag. Bleeding: None.  Movement: Absent. Denies leaking of fluid.   The following portions of the patient's history were reviewed and updated as appropriate: allergies, current medications, past family history, past medical history, past social history, past surgical history and problem list.   Objective:   Vitals:   05/24/20 1623  BP: 111/66  Weight: 154 lb (69.9 kg)    Fetal Status: Fetal Heart Rate (bpm): 159   Movement: Absent     General:  Alert, oriented and cooperative. Patient is in no acute distress.  Skin: Skin is warm and dry. No rash noted.   Cardiovascular: Normal heart rate noted  Respiratory: Normal respiratory effort, no problems with respiration noted  Abdomen: Soft, gravid, appropriate for gestational age.  Pain/Pressure: Absent     Pelvic: Cervical exam deferred        Extremities: Normal range of motion.  Edema: None  Mental Status: Normal mood and affect. Normal behavior. Normal judgment and thought content.   Assessment and Plan:  Pregnancy: G3P1011 at [redacted]w[redacted]d. [redacted] weeks gestation of pregnancy   2. Supervision of other normal pregnancy, antepartum  - Prenat w/o A Vit-FeFum-FePo-FA (CONCEPT OB) 130-92.4-1 MG CAPS; Take 1 capsule by mouth daily.  Dispense: 30 capsule; Refill: 11  3. Rubella non-immune status, antepartum Will need MMR pp  4. Language barrier, cultural differences In person interpreter used  5. Nausea/vomiting in pregnancy Has failed diclegis, phenergan, zofran. - scopolamine (TRANSDERM-SCOP, 1.5  MG,) 1 MG/3DAYS; Place 1 patch (1.5 mg total) onto the skin every 3 (three) days.  Dispense: 10 patch; Refill: 12  General obstetric precautions including but not limited to vaginal bleeding, contractions, leaking of fluid and fetal movement were reviewed in detail with the patient. Please refer to After Visit Summary for other counseling recommendations.   Return in 4 weeks (on 06/21/2020).  Future Appointments  Date Time Provider [place]4/18/2022  1:00 PM [facility] US[facility]  TaDonnamae JudeMD

## 2020-05-24 NOTE — Progress Notes (Signed)
Pt states she is very nauseated

## 2020-06-22 ENCOUNTER — Encounter: Payer: Medicaid Other | Admitting: Family Medicine

## 2020-06-22 ENCOUNTER — Ambulatory Visit (INDEPENDENT_AMBULATORY_CARE_PROVIDER_SITE_OTHER): Payer: Medicaid Other | Admitting: Family Medicine

## 2020-06-22 ENCOUNTER — Other Ambulatory Visit (HOSPITAL_BASED_OUTPATIENT_CLINIC_OR_DEPARTMENT_OTHER): Payer: Self-pay

## 2020-06-22 ENCOUNTER — Other Ambulatory Visit: Payer: Self-pay

## 2020-06-22 VITALS — BP 110/66 | HR 75 | Wt 159.0 lb

## 2020-06-22 DIAGNOSIS — O219 Vomiting of pregnancy, unspecified: Secondary | ICD-10-CM

## 2020-06-22 DIAGNOSIS — Z3A18 18 weeks gestation of pregnancy: Secondary | ICD-10-CM

## 2020-06-22 DIAGNOSIS — O09899 Supervision of other high risk pregnancies, unspecified trimester: Secondary | ICD-10-CM

## 2020-06-22 DIAGNOSIS — Z98891 History of uterine scar from previous surgery: Secondary | ICD-10-CM

## 2020-06-22 DIAGNOSIS — Z3480 Encounter for supervision of other normal pregnancy, unspecified trimester: Secondary | ICD-10-CM

## 2020-06-22 DIAGNOSIS — Z603 Acculturation difficulty: Secondary | ICD-10-CM

## 2020-06-22 DIAGNOSIS — Z2839 Other underimmunization status: Secondary | ICD-10-CM

## 2020-06-22 MED ORDER — SCOPOLAMINE 1 MG/3DAYS TD PT72
1.0000 | MEDICATED_PATCH | TRANSDERMAL | 12 refills | Status: DC
  Filled 2020-06-22: qty 10, 30d supply, fill #0
  Filled 2020-07-20 – 2020-07-21 (×2): qty 10, 30d supply, fill #1
  Filled 2020-08-15: qty 10, 30d supply, fill #2
  Filled 2020-09-26: qty 10, 30d supply, fill #3

## 2020-06-22 NOTE — Progress Notes (Signed)
   PRENATAL VISIT NOTE  Subjective:  Dawn Bullock is a 30 y.o. G3P1011 at [redacted]w[redacted]d being seen today for ongoing prenatal care.  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  Patient reports improvement of nausea with scopolamine patch. Is fasting for Ramadan - eating and drinking plenty of fluids at night, sleeping more during day.  Contractions: Not present. Vag. Bleeding: None.  Movement: Present. Denies leaking of fluid.   The following portions of the patient's history were reviewed and updated as appropriate: allergies, current medications, past family history, past medical history, past social history, past surgical history and problem list.   Objective:   Vitals:   06/22/20 1452  BP: 110/66  Pulse: 75  Weight: 159 lb (72.1 kg)    Fetal Status: Fetal Heart Rate (bpm): 155 Fundal Height: 19 cm Movement: Present     General:  Alert, oriented and cooperative. Patient is in no acute distress.  Skin: Skin is warm and dry. No rash noted.   Cardiovascular: Normal heart rate noted  Respiratory: Normal respiratory effort, no problems with respiration noted  Abdomen: Soft, gravid, appropriate for gestational age.  Pain/Pressure: Absent     Pelvic: Cervical exam deferred        Extremities: Normal range of motion.  Edema: None  Mental Status: Normal mood and affect. Normal behavior. Normal judgment and thought content.   Assessment and Plan:  Pregnancy: G3P1011 at [redacted]w[redacted]d 1. Supervision of other normal pregnancy, antepartum FHT and FH normal  2. Nausea/vomiting in pregnancy - scopolamine (TRANSDERM-SCOP, 1.5 MG,) 1 MG/3DAYS; Place 1 patch (1.5 mg total) onto the skin every 3 (three) days.  Dispense: 10 patch; Refill: 12  3. [redacted] weeks  gestation of pregnancy  4. Language barrier, cultural differences Interpreter used  5. Rubella non-immune status, antepartum MMR post delivery  6. 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 Please refer to After Visit Summary for other counseling recommendations.   Return in about 4 weeks (around 07/20/2020) for OB f/u.  Future Appointments  Date Time Provider [place]  06/26/2020  1:00 PM [facility] US1 [facility]  07/20/2020  3:45 PM Truett Mainland, DO [facility] None    Truett Mainland, DO

## 2020-06-22 NOTE — Progress Notes (Signed)
[facility] interpreter Salma.

## 2020-06-26 ENCOUNTER — Other Ambulatory Visit: Payer: Self-pay

## 2020-06-26 ENCOUNTER — Ambulatory Visit: Payer: Medicaid Other | Attending: Obstetrics and Gynecology

## 2020-06-26 DIAGNOSIS — Z3480 Encounter for supervision of other normal pregnancy, unspecified trimester: Secondary | ICD-10-CM | POA: Diagnosis not present

## 2020-06-27 ENCOUNTER — Other Ambulatory Visit: Payer: Self-pay | Admitting: *Deleted

## 2020-06-27 DIAGNOSIS — Z362 Encounter for other antenatal screening follow-up: Secondary | ICD-10-CM

## 2020-07-20 ENCOUNTER — Other Ambulatory Visit (HOSPITAL_BASED_OUTPATIENT_CLINIC_OR_DEPARTMENT_OTHER): Payer: Self-pay

## 2020-07-20 ENCOUNTER — Ambulatory Visit (INDEPENDENT_AMBULATORY_CARE_PROVIDER_SITE_OTHER): Payer: Medicaid Other | Admitting: Family Medicine

## 2020-07-20 ENCOUNTER — Other Ambulatory Visit: Payer: Self-pay

## 2020-07-20 VITALS — BP 101/59 | HR 103 | Wt 163.0 lb

## 2020-07-20 DIAGNOSIS — Z3A22 22 weeks gestation of pregnancy: Secondary | ICD-10-CM

## 2020-07-20 DIAGNOSIS — R002 Palpitations: Secondary | ICD-10-CM

## 2020-07-20 DIAGNOSIS — Z3480 Encounter for supervision of other normal pregnancy, unspecified trimester: Secondary | ICD-10-CM

## 2020-07-20 DIAGNOSIS — Z98891 History of uterine scar from previous surgery: Secondary | ICD-10-CM

## 2020-07-20 DIAGNOSIS — Z2839 Other underimmunization status: Secondary | ICD-10-CM

## 2020-07-20 DIAGNOSIS — Z603 Acculturation difficulty: Secondary | ICD-10-CM

## 2020-07-20 NOTE — Progress Notes (Signed)
   PRENATAL VISIT NOTE  Subjective:  Dawn Bullock is a 30 y.o. G3P1011 at 50w5dbeing seen today for ongoing prenatal care.  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  Patient reports had episode of palpitations and SOB. Felt like she needed to get air. Lasted for a few minutes, then went away. Was not active during episode. Occasional pelvic cramping. Occasional pressure from IBS.  Contractions: Not present. Vag. Bleeding: None.  Movement: Present. Denies leaking of fluid.   The following portions of the patient's history were reviewed and updated as appropriate: allergies, current medications, past family history, past medical history, past social history, past surgical history and problem list.   Objective:   Vitals:   07/20/20 1552  BP: (!) 101/59  Pulse: (!) 103  Weight: 163 lb (73.9 kg)    Fetal Status: Fetal Heart Rate (bpm): 143 Fundal Height: 23 cm Movement: Present     General:  Alert, oriented and cooperative. Patient is in no acute distress.  Skin: Skin is warm and dry. No rash noted.   Cardiovascular: Normal heart rate noted  Respiratory: Normal respiratory effort, no problems with respiration noted  Abdomen: Soft, gravid, appropriate for gestational age.  Pain/Pressure: Present     Pelvic: Cervical exam deferred        Extremities: Normal range of motion.  Edema: Trace  Mental Status: Normal mood and affect. Normal behavior. Normal judgment and thought content.   Assessment and Plan:  Pregnancy: G3P1011 at 213w5d. [redacted] weeks gestation of pregnancy 2. Supervision of other normal pregnancy, antepartum FHT and FH normal. 2hr GTT next appt.  3. 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 Needs C/section at 37 weeks.  4. Rubella non-immune status, antepartum MMR post delivery  5. Language barrier, cultural differences Interpreter used.  6. Palpitations Patient to keep track of episodes. If frequent, then may need cardiology referral.  Preterm labor symptoms and general obstetric precautions including but not limited to vaginal bleeding, contractions, leaking of fluid and fetal movement were reviewed in detail with the patient. Please refer to After Visit Summary for other counseling recommendations.   No follow-ups on file.  Future Appointments  Date Time Provider [place]6/09/2020  9:55 AM WiSeabron SpatesCNM [facility] None  08/21/2020 10:45 AM [facility] NURSE [facility]6/13/2022 11:00 AM [facility] US1 [facility] WMLa LuisaDO

## 2020-07-20 NOTE — Progress Notes (Signed)
Language Resources interpreter [employer].

## 2020-07-21 ENCOUNTER — Other Ambulatory Visit (HOSPITAL_COMMUNITY): Payer: Self-pay

## 2020-07-21 ENCOUNTER — Other Ambulatory Visit (HOSPITAL_BASED_OUTPATIENT_CLINIC_OR_DEPARTMENT_OTHER): Payer: Self-pay

## 2020-07-28 ENCOUNTER — Emergency Department (HOSPITAL_BASED_OUTPATIENT_CLINIC_OR_DEPARTMENT_OTHER)
Admission: EM | Admit: 2020-07-28 | Discharge: 2020-07-28 | Disposition: A | Discharge status: Home / Self Care | Payer: Medicaid Other | Attending: Emergency Medicine | Admitting: Emergency Medicine

## 2020-07-28 ENCOUNTER — Other Ambulatory Visit: Payer: Self-pay

## 2020-07-28 ENCOUNTER — Encounter (HOSPITAL_BASED_OUTPATIENT_CLINIC_OR_DEPARTMENT_OTHER): Payer: Self-pay | Admitting: *Deleted

## 2020-07-28 DIAGNOSIS — R197 Diarrhea, unspecified: Secondary | ICD-10-CM | POA: Insufficient documentation

## 2020-07-28 DIAGNOSIS — R1030 Lower abdominal pain, unspecified: Secondary | ICD-10-CM | POA: Diagnosis not present

## 2020-07-28 DIAGNOSIS — O26892 Other specified pregnancy related conditions, second trimester: Secondary | ICD-10-CM | POA: Insufficient documentation

## 2020-07-28 DIAGNOSIS — Z3A24 24 weeks gestation of pregnancy: Secondary | ICD-10-CM | POA: Insufficient documentation

## 2020-07-28 LAB — CBC WITH DIFFERENTIAL/PLATELET
Abs Immature Granulocytes: 0.18 10*3/uL — ABNORMAL HIGH (ref 0.00–0.07)
Basophils Absolute: 0 10*3/uL (ref 0.0–0.1)
Basophils Relative: 0 %
Eosinophils Absolute: 0.1 10*3/uL (ref 0.0–0.5)
Eosinophils Relative: 1 %
HCT: 32.4 % — ABNORMAL LOW (ref 36.0–46.0)
Hemoglobin: 11.2 g/dL — ABNORMAL LOW (ref 12.0–15.0)
Immature Granulocytes: 2 %
Lymphocytes Relative: 16 %
Lymphs Abs: 1.7 10*3/uL (ref 0.7–4.0)
MCH: 31.5 pg (ref 26.0–34.0)
MCHC: 34.6 g/dL (ref 30.0–36.0)
MCV: 91.3 fL (ref 80.0–100.0)
Monocytes Absolute: 0.7 10*3/uL (ref 0.1–1.0)
Monocytes Relative: 6 %
Neutro Abs: 8 10*3/uL — ABNORMAL HIGH (ref 1.7–7.7)
Neutrophils Relative %: 75 %
Platelets: 215 10*3/uL (ref 150–400)
RBC: 3.55 MIL/uL — ABNORMAL LOW (ref 3.87–5.11)
RDW: 13.7 % (ref 11.5–15.5)
WBC: 10.6 10*3/uL — ABNORMAL HIGH (ref 4.0–10.5)
nRBC: 0 % (ref 0.0–0.2)

## 2020-07-28 LAB — COMPREHENSIVE METABOLIC PANEL
ALT: 8 U/L (ref 0–44)
AST: 14 U/L — ABNORMAL LOW (ref 15–41)
Albumin: 3.1 g/dL — ABNORMAL LOW (ref 3.5–5.0)
Alkaline Phosphatase: 64 U/L (ref 38–126)
Anion gap: 9 (ref 5–15)
BUN: 6 mg/dL (ref 6–20)
CO2: 20 mmol/L — ABNORMAL LOW (ref 22–32)
Calcium: 8.3 mg/dL — ABNORMAL LOW (ref 8.9–10.3)
Chloride: 106 mmol/L (ref 98–111)
Creatinine, Ser: 0.38 mg/dL — ABNORMAL LOW (ref 0.44–1.00)
GFR, Estimated: >60 mL/min (ref >60)
Glucose, Bld: 91 mg/dL (ref 70–99)
Potassium: 3.5 mmol/L (ref 3.5–5.1)
Sodium: 135 mmol/L (ref 135–145)
Total Bilirubin: 0.2 mg/dL — ABNORMAL LOW (ref 0.3–1.2)
Total Protein: 6.6 g/dL (ref 6.5–8.1)

## 2020-07-28 LAB — URINALYSIS, ROUTINE W REFLEX MICROSCOPIC
Bilirubin Urine: NEGATIVE
Glucose, UA: NEGATIVE mg/dL
Ketones, ur: NEGATIVE mg/dL
Leukocytes,Ua: NEGATIVE
Nitrite: NEGATIVE
Protein, ur: NEGATIVE mg/dL
Specific Gravity, Urine: >1.03 — ABNORMAL HIGH (ref 1.005–1.030)
pH: 6 (ref 5.0–8.0)

## 2020-07-28 LAB — URINALYSIS, MICROSCOPIC (REFLEX)

## 2020-07-28 LAB — LIPASE, BLOOD: Lipase: 25 U/L (ref 11–51)

## 2020-07-28 LAB — HCG, QUANTITATIVE, PREGNANCY: hCG, Beta Chain, Quant, S: 33012 m[IU]/mL — ABNORMAL HIGH (ref <5)

## 2020-07-28 MED ORDER — ACETAMINOPHEN 325 MG PO TABS
650.0000 mg | ORAL_TABLET | Freq: Once | ORAL | Status: AC
  Administered 2020-07-28: 650 mg via ORAL
  Filled 2020-07-28: qty 2

## 2020-07-28 NOTE — ED Notes (Signed)
OB RR RN notified of patient's presence in department by Janetta Hora. Pt reports abd pain since last week, states notified her OB of same at appointment last week.

## 2020-07-28 NOTE — ED Triage Notes (Addendum)
C/o lower abd pelvic pain with " some discharge" x 2 days, increased pain x 1 day , reports 24 weeks 6 days preg  . Denies painful urination or  vaginal bleeding.  OBGYN [facility] Dr Christella Hartigan.

## 2020-07-28 NOTE — ED Provider Notes (Signed)
[facility] EMERGENCY DEPARTMENT Provider Note   CSN: [id number] Arrival date & time: 07/28/20  1659     History Chief Complaint  Patient presents with  . Abdominal Pain    Dawn Bullock is a 30 y.o. female with no pertinent past medical history that presents to the Emergency Department today for abdominal pain, patient is [redacted] weeks pregnant.  Patient takes Arabic, interpreter was used.  Patient states that lower abdominal pain primarily started last night, and suprapubic region, does not radiate anywhere.  Is concerned because she does have history of ectopic pregnancy.  Denies any vaginal bleeding, abnormal vaginal discharge.  Patient follows up with the women Center at [facility], states that she did see her OB last week.  Patient denies any fevers or chills.  Patient states that she does have some diarrhea, has had it for a while, only once in the morning. Some times will have some abdominal cramping, states that pain feels slightly similar, however worse in the suprapubic region that started yesterday. No recent travel or fevers.  Denies any dysuria or hematuria.  Patient is extremely concerned about her baby status, tearful on exam.  Denies any cough, URI symptoms fevers myalgias, chest pain or shortness of breath. No other complaints, pain does not radiate anywhere, no back pain.  Patient states that the pain is pretty mild right now.  HPI     Past Medical History:  Diagnosis Date  . Hypotension   . Medical history non-contributory     Patient Active Problem List   Diagnosis Date Noted  . Rubella non-immune status, antepartum 05/01/2020  . Supervision of other normal pregnancy, antepartum 04/26/2020  . History of uterine scar due to previous surgery 09/22/2018  . Language barrier, cultural differences 09/04/2018    Past Surgical History:  Procedure Laterality Date  . CESAREAN SECTION  01/2019  . EXPLORATORY LAPAROTOMY  09/22/2018   Exploratory Laparotomy and  Hysterotomy with removal of Ectopic Pregnancy (N/A)  . LAPAROTOMY N/A 09/22/2018   Procedure: Exploratory Laparotomy and Hysterotomy with removal of Ectopic Pregnancy;  Surgeon: [place] Bing, MD;  Location: [facility] OR;  Service: Gynecology;  Laterality: N/A;  . UNILATERAL SALPINGECTOMY Right 09/22/2018   Procedure: Unilateral Salpingectomy;  Surgeon: [place] Bing, MD;  Location: [facility] OR;  Service: Gynecology;  Laterality: Right;     OB History    Gravida  3   Para  1   Term  1   Preterm      AB  1   Living  1     SAB      IAB      Ectopic  1   Multiple      Live Births  1           No family history on file.  Social History   Tobacco Use  . Smoking status: Never Smoker  . Smokeless tobacco: Never Used  Substance Use Topics  . Alcohol use: Not Currently  . Drug use: Not Currently    Home Medications Prior to Admission medications   Medication Sig Start Date End Date Taking? Authorizing Provider  scopolamine (TRANSDERM-SCOP, 1.5 MG,) 1 MG/3DAYS Place 1 patch (1.5 mg total) onto the skin every 3 (three) days. 06/22/20   Levie Heritage, DO    Allergies    Patient has no known allergies.  Review of Systems   Review of Systems  Constitutional: Negative for chills, diaphoresis, fatigue and fever.  HENT: Negative for congestion, sore throat  and trouble swallowing.   Eyes: Negative for pain and visual disturbance.  Respiratory: Negative for cough, shortness of breath and wheezing.   Cardiovascular: Negative for chest pain, palpitations and leg swelling.  Gastrointestinal: Positive for abdominal pain and diarrhea. Negative for abdominal distention, nausea and vomiting.  Genitourinary: Negative for difficulty urinating.  Musculoskeletal: Negative for back pain, neck pain and neck stiffness.  Skin: Negative for pallor.  Neurological: Negative for dizziness, speech difficulty, weakness and headaches.  Psychiatric/Behavioral: Negative for confusion.     Physical Exam Updated Vital Signs BP 110/64 (BP Location: Left Arm)   Pulse 78   Temp 98 F (36.7 C) (Oral)   Resp 16   LMP 02/12/2020   SpO2 99%   Physical Exam Constitutional:      General: She is not in acute distress.    Appearance: Normal appearance. She is not ill-appearing, toxic-appearing or diaphoretic.  HENT:     Mouth/Throat:     Mouth: Mucous membranes are moist.     Pharynx: Oropharynx is clear.  Eyes:     General: No scleral icterus.    Extraocular Movements: Extraocular movements intact.     Pupils: Pupils are equal, round, and reactive to light.  Cardiovascular:     Rate and Rhythm: Normal rate and regular rhythm.     Pulses: Normal pulses.     Heart sounds: Normal heart sounds.  Pulmonary:     Effort: Pulmonary effort is normal. No respiratory distress.     Breath sounds: Normal breath sounds. No stridor. No wheezing, rhonchi or rales.  Chest:     Chest wall: No tenderness.  Abdominal:     General: Abdomen is flat. There is no distension.     Palpations: Abdomen is soft.     Tenderness: There is no abdominal tenderness. There is no guarding or rebound.     Comments: Patient does not have any abdominal tenderness on exam.  Fetal tones 146 with monitor.  Musculoskeletal:        General: No swelling or tenderness. Normal range of motion.     Cervical back: Normal range of motion and neck supple. No rigidity.     Right lower leg: No edema.     Left lower leg: No edema.  Skin:    General: Skin is warm and dry.     Capillary Refill: Capillary refill takes less than 2 seconds.     Coloration: Skin is not pale.  Neurological:     General: No focal deficit present.     Mental Status: She is alert and oriented to person, place, and time.  Psychiatric:        Mood and Affect: Mood normal.        Behavior: Behavior normal.     ED Results / Procedures / Treatments   Labs (all labs ordered are listed, but only abnormal results are displayed) Labs  Reviewed  URINALYSIS, ROUTINE W REFLEX MICROSCOPIC - Abnormal; Notable for the following components:      Result Value   Specific Gravity, Urine >1.030 (*)    Hgb urine dipstick SMALL (*)    All other components within normal limits  COMPREHENSIVE METABOLIC PANEL - Abnormal; Notable for the following components:   CO2 20 (*)    Creatinine, Ser 0.38 (*)    Calcium 8.3 (*)    Albumin 3.1 (*)    AST 14 (*)    Total Bilirubin 0.2 (*)    All other components within normal limits  CBC WITH DIFFERENTIAL/PLATELET - Abnormal; Notable for the following components:   WBC 10.6 (*)    RBC 3.55 (*)    Hemoglobin 11.2 (*)    HCT 32.4 (*)    Neutro Abs 8.0 (*)    Abs Immature Granulocytes 0.18 (*)    All other components within normal limits  HCG, QUANTITATIVE, PREGNANCY - Abnormal; Notable for the following components:   hCG, Beta Chain, Quant, S 33,012 (*)    All other components within normal limits  URINALYSIS, MICROSCOPIC (REFLEX) - Abnormal; Notable for the following components:   Bacteria, UA FEW (*)    All other components within normal limits  LIPASE, BLOOD    EKG None  Radiology No results found.  Procedures Procedures   Medications Ordered in ED Medications  acetaminophen (TYLENOL) tablet 650 mg (650 mg Oral Given 07/28/20 1851)    ED Course  I have reviewed the triage vital signs and the nursing notes.  Pertinent labs & imaging results that were available during my care of the patient were reviewed by me and considered in my medical decision making (see chart for details).    MDM Rules/Calculators/A&P                          Dawn Bullock is a 30 y.o. female with no pertinent past medical history that presents to the Emergency Department today for abdominal pain, patient is [redacted] weeks pregnant.  Patient is nontoxic-appearing, appears very well however patient concerned because she does have history of ectopic, patient with tenderness to suprapubic region  obtain basic labs.  Abdominal pain without any tenderness on exam.  Fetal monitoring shows heart rate of 146.  Did call rapid OB who states that she will call me back after speaking to her attending.  After they reviewed fetal monitoring tracing, rapid OB call me back, Carollee Herter who discussed that patient is cleared by OB.  They do not think that patient is contracting, patient is having any vaginal bleeding, her and her attending Dr. Despina Hidden do not recommend any ultrasounds or further testing at this time.  Work-up today unremarkable, patient to be discharged at this time.  Did discuss that patient could be having severe abdominal pain for multiple reasons, however pain is mild and cleared by OB, patient will follow up with her OB appointment on June 7 schedule.  Symptomatic treatment discussed in regards to Tylenol, patient agreeable with plan.  We will also have patient follow-up with community health and wellness, patient does not PCP.  Doubt need for further emergent work up at this time. I explained the diagnosis and have given explicit precautions to return to the ER including for any other new or worsening symptoms. The patient understands and accepts the medical plan as it's been dictated and I have answered their questions. Discharge instructions concerning home care and prescriptions have been given. The patient is STABLE and is discharged to home in good condition.   Final Clinical Impression(s) / ED Diagnoses Final diagnoses:  Abdominal pain during pregnancy in second trimester    Rx / DC Orders ED Discharge Orders    None       Farrel Gordon, PA-C 07/28/20 2001    Koleen Distance, MD 07/28/20 [phone]

## 2020-07-28 NOTE — Progress Notes (Signed)
Cleared by OB Service

## 2020-07-28 NOTE — Discharge Instructions (Addendum)
  tama taqyimuk fi qism altawari wabaed taqyim daqiq , lam najid 'aya halat tariat tatatalab dukhulak 'aw 'iijra' mazid min alfuhusat fi almustashfaa. kan alaikhtibar / alaikhtibar alkhasu bik alyawm mtmynan bishakl eami. 'uriduk 'an tutabie mae tabib alnisa' walwiladat alkhasi bik fi 7 yuniu kama hu muqararun. yurjaa alaistimrar fi shurb alkathir min alma' walraja' tahdid maweid mae tabib alrieayat al'awaliat , ras aljinin eindama tadhhab 'iilaa sihat almujtamae waleafiat Cavalero , yatimu tawfir almaelumat alkhasat bihim fi hadhih alhuzmati. aistakhdam altaelimat almurfaqata. yumkinuk 'akhdh taelimat taylinul ealaa alzujajat lil'almi. 'iidha kan ladayk 'ayu 'aerad jadidat 'aw muqliqat , yurjaa alrujue 'iilaa ER. yurjaa aleawdat 'iilaa qism altawari 'iidha wajahat 'aya tadahwur fi halatika. shkran lak ealaa alsamah lana bi'an nakun jz'an min rieayatika. yurjaa altahaduth 'iilaa alsaydalii alkhasi bik ean 'ayi 'adwiat jadidat mawsufat alyawm fima yataealaq bialathar aljanibiat 'aw altafaeulat mae al'adwiat al'ukhraa.   You were evaluated in the Emergency Department and after careful evaluation, we did not find any emergent condition requiring admission or further testing in the hospital.   Your exam/testing today was overall reassuring.  I want you to follow-up with your OB/GYN on June 7 as scheduled.  Please continue to drink plenty of water and please make an appointment with a primary care doctor, you go to [facility] health community health and wellness, their information is provided in this packet.  Use the attached instructions.  You can take Tylenol instructed on the bottle for pain.  If you have any new or concerning symptom please back to the ER. Please return to the Emergency Department if you experience any worsening of your condition.  Thank you for allowing [place] to be a part of your care. Please speak to your pharmacist about any new medications prescribed today in regards to side effects or  interactions with other medications.

## 2020-07-28 NOTE — Progress Notes (Addendum)
G3P1 at 23 6/7 weeks reports to [facility] ED with c/o lower abd pain with some discharge x2 days.  No bleeding or leaking. Talked with PA in ED.  Monitors applied per ED RN.  Dopplered FHT's 150's. Receives [facility].  Talked with Dr Otelia Limes regarding POC.  Get UA.  If no cxns seen on monitor, may D/C home.  Keep scheduled appt with [facility].  Scheduled for June 7.  Call office with questions and concerns.

## 2020-07-28 NOTE — ED Notes (Signed)
Cleared by OB

## 2020-07-31 ENCOUNTER — Telehealth: Payer: Self-pay

## 2020-07-31 NOTE — Telephone Encounter (Addendum)
Interpreter ID: [id number]  Transition Care Management Follow-up Telephone Call  Date of discharge and from where: 07/28/2020 from [facility]  How have you been since you were released from the hospital? Pt states that she is feeling better since she has been home from the hospital.   Any questions or concerns? No  Items Reviewed:  Did the pt receive and understand the discharge instructions provided? Yes   Medications obtained and verified? Yes   Other? Yes   Any new allergies since your discharge? No   Dietary orders reviewed? n/a  Do you have support at home? Yes   Functional Questionnaire: (I = Independent and D = Dependent) ADLs: I  Bathing/Dressing- I  Meal Prep- I  Eating- I  Maintaining continence- I  Transferring/Ambulation- I  Managing Meds- I   Follow up appointments reviewed:   PCP Hospital f/u appt confirmed? No    Specialist Hospital f/u appt confirmed? Yes  Scheduled to see Wynelle Bourgeois, CNM on 08/15/2020 @ 09:55am.  Are transportation arrangements needed? No   If their condition worsens, is the pt aware to call PCP or go to the Emergency Dept.? Yes  Was the patient provided with contact information for the PCP's office or ED? Yes  Was to pt encouraged to call back with questions or concerns? Yes     .

## 2020-08-15 ENCOUNTER — Encounter: Payer: Self-pay | Admitting: Advanced Practice Midwife

## 2020-08-15 ENCOUNTER — Other Ambulatory Visit: Payer: Self-pay

## 2020-08-15 ENCOUNTER — Ambulatory Visit (INDEPENDENT_AMBULATORY_CARE_PROVIDER_SITE_OTHER): Payer: Medicaid Other | Admitting: Advanced Practice Midwife

## 2020-08-15 ENCOUNTER — Other Ambulatory Visit (HOSPITAL_BASED_OUTPATIENT_CLINIC_OR_DEPARTMENT_OTHER): Payer: Self-pay

## 2020-08-15 DIAGNOSIS — O09899 Supervision of other high risk pregnancies, unspecified trimester: Secondary | ICD-10-CM

## 2020-08-15 DIAGNOSIS — Z2839 Other underimmunization status: Secondary | ICD-10-CM

## 2020-08-15 DIAGNOSIS — Z3480 Encounter for supervision of other normal pregnancy, unspecified trimester: Secondary | ICD-10-CM

## 2020-08-15 NOTE — Progress Notes (Signed)
   PRENATAL VISIT NOTE  Subjective:  Dawn Bullock is a 30 y.o. G3P1011 at [redacted]w[redacted]d being seen today for ongoing prenatal care.  She is currently monitored for the following issues for this low-risk pregnancy and has Language barrier, cultural differences; History of uterine scar due to previous surgery; Supervision of other normal pregnancy, antepartum; and Rubella non-immune status, antepartum on their problem list.  Patient reports intermittent lightheadedness, occ headaches, some pain when baby moves.  Contractions: Not present. Vag. Bleeding: None.  Movement: Present. Denies leaking of fluid.   The following portions of the patient's history were reviewed and updated as appropriate: allergies, current medications, past family history, past medical history, past social history, past surgical history and problem list.   Objective:   Vitals:   08/15/20 1006  BP: (!) 102/57  Pulse: 75  Weight: 175 lb (79.4 kg)    Fetal Status: Fetal Heart Rate (bpm): 144 Fundal Height: 27 cm Movement: Present     General:  Alert, oriented and cooperative. Patient is in no acute distress.  Skin: Skin is warm and dry. No rash noted.   Cardiovascular: Normal heart rate noted  Respiratory: Normal respiratory effort, no problems with respiration noted  Abdomen: Soft, gravid, appropriate for gestational age.  Pain/Pressure: Present     Pelvic: Cervical exam deferred        Extremities: Normal range of motion.  Edema: Trace  Mental Status: Normal mood and affect. Normal behavior. Normal judgment and thought content.   Assessment and Plan:  Pregnancy: G3P1011 at [redacted]w[redacted]d 1. Rubella non-immune status, antepartum    Vaccine pp  2. Supervision of other normal pregnancy, antepartum     Discussed hydration, normal drop in BP second trimester, wearing supp hose     Discussed nature of PTL (denies UCs), round ligament pain     May use Tylenol for Headaches q 6 hrs prn     Will reschedule  Glucola  Preterm labor symptoms and general obstetric precautions including but not limited to vaginal bleeding, contractions, leaking of fluid and fetal movement were reviewed in detail with the patient. Please refer to After Visit Summary for other counseling recommendations.   Return in about 3 weeks (around 09/05/2020) for [facility].  Future Appointments  Date Time Provider Department Center  08/21/2020 10:45 AM [facility] NURSE [facility]  08/21/2020 11:00 AM [facility] US1 [facility]  08/22/2020  8:30 AM [facility] NURSE [facility] None  08/29/2020  8:15 AM Aviva Signs, CNM [facility] None  09/12/2020 10:05 AM Aviva Signs, CNM [facility] None  09/26/2020  9:15 AM Aviva Signs, CNM [facility] None  10/09/2020  9:00 AM Willodean Rosenthal, MD [facility] None  10/24/2020  9:15 AM Aviva Signs, CNM [facility] None  10/31/2020  9:15 AM Aviva Signs, CNM [facility] None  11/06/2020  9:15 AM Milas Hock, MD [facility] None    Wynelle Bourgeois, CNM

## 2020-08-15 NOTE — Progress Notes (Signed)
Pt states her BP gets low at home sometimes and she feels dizzy

## 2020-08-15 NOTE — Patient Instructions (Signed)

## 2020-08-21 ENCOUNTER — Ambulatory Visit: Payer: Medicaid Other | Attending: Obstetrics

## 2020-08-21 ENCOUNTER — Other Ambulatory Visit: Payer: Self-pay

## 2020-08-21 ENCOUNTER — Ambulatory Visit: Payer: Medicaid Other

## 2020-08-21 DIAGNOSIS — Z362 Encounter for other antenatal screening follow-up: Secondary | ICD-10-CM | POA: Insufficient documentation

## 2020-08-22 ENCOUNTER — Other Ambulatory Visit: Payer: Medicaid Other

## 2020-08-22 ENCOUNTER — Other Ambulatory Visit: Payer: Self-pay | Admitting: *Deleted

## 2020-08-22 DIAGNOSIS — Z362 Encounter for other antenatal screening follow-up: Secondary | ICD-10-CM

## 2020-08-29 ENCOUNTER — Other Ambulatory Visit: Payer: Self-pay

## 2020-08-29 ENCOUNTER — Ambulatory Visit (INDEPENDENT_AMBULATORY_CARE_PROVIDER_SITE_OTHER): Payer: Medicaid Other | Admitting: Advanced Practice Midwife

## 2020-08-29 ENCOUNTER — Encounter: Payer: Self-pay | Admitting: Advanced Practice Midwife

## 2020-08-29 VITALS — BP 105/57 | HR 76 | Wt 177.0 lb

## 2020-08-29 DIAGNOSIS — Z603 Acculturation difficulty: Secondary | ICD-10-CM

## 2020-08-29 DIAGNOSIS — Z23 Encounter for immunization: Secondary | ICD-10-CM | POA: Diagnosis not present

## 2020-08-29 DIAGNOSIS — Z3A28 28 weeks gestation of pregnancy: Secondary | ICD-10-CM

## 2020-08-29 NOTE — Progress Notes (Signed)
[facility] Language interpreter Reem [phone].

## 2020-08-29 NOTE — Progress Notes (Signed)
   PRENATAL VISIT NOTE  Subjective:  Dawn Bullock is a 30 y.o. G3P1011 at [redacted]w[redacted]d being seen today for ongoing prenatal care.  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  Patient reports no complaints.  Contractions: Not present. Vag. Bleeding: None.  Movement: Present. Denies leaking of fluid.   The following portions of the patient's history were reviewed and updated as appropriate: allergies, current medications, past family history, past medical history, past social history, past surgical history and problem list.   Objective:   Vitals:   08/29/20 0825  BP: (!) 105/57  Pulse: 76  Weight: 177 lb (80.3 kg)    Fetal Status: Fetal Heart Rate (bpm): 138   Movement: Present     General:  Alert, oriented and cooperative. Patient is in no acute distress.  Skin: Skin is warm and dry. No rash noted.   Cardiovascular: Normal heart rate noted  Respiratory: Normal respiratory effort, no problems with respiration noted  Abdomen: Soft, gravid, appropriate for gestational age.  Pain/Pressure: Present     Pelvic: Cervical exam deferred        Extremities: Normal range of motion.  Edema: None  Mental Status: Normal mood and affect. Normal behavior. Normal judgment and thought content.   Assessment and Plan:  Pregnancy: G3P1011 at [redacted]w[redacted]d 1. [redacted] weeks gestation of pregnancy States dizziness has resolved Denies contractions or bleeding Glucola today  - CBC - Glucose Tolerance, 2 Hours w/1 Hour - HIV Antibody (routine testing w rflx) - RPR - Tdap vaccine greater than or equal to 7yo IM  2. Language barrier, cultural differences Video interpretor used  Preterm labor symptoms and general obstetric precautions including but not limited to  vaginal bleeding, contractions, leaking of fluid and fetal movement were reviewed in detail with the patient. Please refer to After Visit Summary for other counseling recommendations.   Return in about 2 weeks (around 09/12/2020) for [facility].  Future Appointments  Date Time Provider Department Center  09/12/2020 10:05 AM Aviva Signs, CNM [facility] None  09/26/2020  9:15 AM Aviva Signs, CNM [facility] None  10/09/2020  9:00 AM Willodean Rosenthal, MD [facility] None  10/10/2020 10:30 AM [facility] NURSE [facility]  10/10/2020 10:45 AM [facility] US5 [facility]  10/24/2020  9:15 AM Aviva Signs, CNM [facility] None  10/31/2020  9:15 AM Aviva Signs, CNM [facility] None  11/06/2020  9:15 AM Milas Hock, MD [facility] None    Wynelle Bourgeois, CNM

## 2020-08-30 LAB — CBC
Hematocrit: 31.6 % — ABNORMAL LOW (ref 34.0–46.6)
Hemoglobin: 10.8 g/dL — ABNORMAL LOW (ref 11.1–15.9)
MCH: 31.2 pg (ref 26.6–33.0)
MCHC: 34.2 g/dL (ref 31.5–35.7)
MCV: 91 fL (ref 79–97)
Platelets: 211 10*3/uL (ref 150–450)
RBC: 3.46 x10E6/uL — ABNORMAL LOW (ref 3.77–5.28)
RDW: 12.4 % (ref 11.7–15.4)
WBC: 10.8 10*3/uL (ref 3.4–10.8)

## 2020-08-30 LAB — RPR: RPR Ser Ql: NONREACTIVE

## 2020-08-30 LAB — GLUCOSE TOLERANCE, 2 HOURS W/ 1HR
Glucose, 1 hour: 106 mg/dL (ref 65–179)
Glucose, 2 hour: 92 mg/dL (ref 65–152)
Glucose, Fasting: 80 mg/dL (ref 65–91)

## 2020-08-30 LAB — HIV ANTIBODY (ROUTINE TESTING W REFLEX): HIV Screen 4th Generation wRfx: NONREACTIVE

## 2020-09-12 ENCOUNTER — Other Ambulatory Visit: Payer: Self-pay

## 2020-09-12 ENCOUNTER — Encounter: Payer: Self-pay | Admitting: Advanced Practice Midwife

## 2020-09-12 ENCOUNTER — Ambulatory Visit (INDEPENDENT_AMBULATORY_CARE_PROVIDER_SITE_OTHER): Payer: Medicaid Other | Admitting: Advanced Practice Midwife

## 2020-09-12 VITALS — BP 107/61 | HR 78 | Wt 183.0 lb

## 2020-09-12 DIAGNOSIS — Z3A30 30 weeks gestation of pregnancy: Secondary | ICD-10-CM

## 2020-09-12 DIAGNOSIS — Z3480 Encounter for supervision of other normal pregnancy, unspecified trimester: Secondary | ICD-10-CM

## 2020-09-12 NOTE — Progress Notes (Signed)
   PRENATAL VISIT NOTE  Subjective:  Dawn Bullock is a 30 y.o. G3P1011 at [redacted]w[redacted]d being seen today for ongoing prenatal care.  She is currently monitored for the following issues for this low-risk pregnancy and has Language barrier, cultural differences; History of uterine scar due to previous surgery; Supervision of other normal pregnancy, antepartum; and Rubella non-immune status, antepartum on their problem list.  Patient reports  none .  Contractions: Irritability. Vag. Bleeding: None.  Movement: Present. Denies leaking of fluid.   The following portions of the patient's history were reviewed and updated as appropriate: allergies, current medications, past family history, past medical history, past social history, past surgical history and problem list.   Objective:   Vitals:   09/12/20 1010  BP: 107/61  Pulse: 78  Weight: 183 lb (83 kg)    Fetal Status: Fetal Heart Rate (bpm): 152   Movement: Present     General:  Alert, oriented and cooperative. Patient is in no acute distress.  Skin: Skin is warm and dry. No rash noted.   Cardiovascular: Normal heart rate noted  Respiratory: Normal respiratory effort, no problems with respiration noted  Abdomen: Soft, gravid, appropriate for gestational age.  Pain/Pressure: Absent     Pelvic: Cervical exam deferred        Extremities: Normal range of motion.  Edema: None  Mental Status: Normal mood and affect. Normal behavior. Normal judgment and thought content.   Assessment and Plan:  Pregnancy: G3P1011 at [redacted]w[redacted]d 1. [redacted] weeks gestation of pregnancy     Need to schedule C/S for 39 wks     Forgot to have her sign VBAC consent  2. Supervision of other normal pregnancy, antepartum     Informed her Glucola was normal  Preterm labor symptoms and general obstetric precautions including but not limited to vaginal bleeding, contractions, leaking of fluid and fetal movement were reviewed in detail with the patient. Please refer to After  Visit Summary for other counseling recommendations.   Return in about 2 weeks (around 09/26/2020) for [facility].  Future Appointments  Date Time Provider Department Center  09/26/2020  9:15 AM Aviva Signs, CNM [facility] None  10/09/2020  8:55 AM Willodean Rosenthal, MD [facility] None  10/10/2020 10:30 AM [facility] NURSE [facility]  10/10/2020 10:45 AM [facility] US5 [facility]  10/24/2020  9:15 AM Aviva Signs, CNM [facility] None  10/31/2020  9:15 AM Aviva Signs, CNM [facility] None  11/06/2020  9:15 AM Milas Hock, MD [facility] None    Wynelle Bourgeois, CNM

## 2020-09-12 NOTE — Progress Notes (Signed)
Language Resources interpreter Hanan.

## 2020-09-14 ENCOUNTER — Telehealth: Payer: Self-pay | Admitting: *Deleted

## 2020-09-14 ENCOUNTER — Encounter: Payer: Self-pay | Admitting: *Deleted

## 2020-09-14 NOTE — Telephone Encounter (Signed)
Call to patient with Arabic interpretor from [place], [place] [id number]. Called patient cell number, no answer and no voice mail set-up. Call to home number, Spouse Al-Hussein states he is not wiith her since he is at work. Spouse understands some English with interpretor. Advised of procedure date of Monday 10-30-20 at 1230- arrive 1030 and letter will come in mail. Will also review further at appointment with patient.  Encounter closed.

## 2020-09-26 ENCOUNTER — Ambulatory Visit (INDEPENDENT_AMBULATORY_CARE_PROVIDER_SITE_OTHER): Payer: Medicaid Other

## 2020-09-26 ENCOUNTER — Other Ambulatory Visit: Payer: Self-pay

## 2020-09-26 ENCOUNTER — Other Ambulatory Visit (HOSPITAL_BASED_OUTPATIENT_CLINIC_OR_DEPARTMENT_OTHER): Payer: Self-pay

## 2020-09-26 VITALS — BP 99/58 | HR 92 | Wt 185.0 lb

## 2020-09-26 DIAGNOSIS — Z3480 Encounter for supervision of other normal pregnancy, unspecified trimester: Secondary | ICD-10-CM

## 2020-09-26 DIAGNOSIS — Z2839 Other underimmunization status: Secondary | ICD-10-CM

## 2020-09-26 DIAGNOSIS — O09899 Supervision of other high risk pregnancies, unspecified trimester: Secondary | ICD-10-CM

## 2020-09-26 DIAGNOSIS — Z603 Acculturation difficulty: Secondary | ICD-10-CM

## 2020-09-26 DIAGNOSIS — Z3A32 32 weeks gestation of pregnancy: Secondary | ICD-10-CM

## 2020-09-26 DIAGNOSIS — Z98891 History of uterine scar from previous surgery: Secondary | ICD-10-CM

## 2020-09-26 NOTE — Progress Notes (Signed)
CAP interpretet Bedor.

## 2020-09-26 NOTE — Progress Notes (Signed)
HIGH-RISK PREGNANCY OFFICE VISIT  Patient name: Dawn Bullock MRN [id number]  Date of birth: May 11, 1990 Chief Complaint:   Routine Prenatal Visit  Subjective:   Dawn Bullock is a 30 y.o. G61P1011 female at [redacted]w[redacted]d with an Estimated Date of Delivery: 11/18/20 being seen today for ongoing management of a high-risk pregnancy aeb has Language barrier, cultural differences; History of uterine scar due to previous surgery; Supervision of other normal pregnancy, antepartum; and Rubella non-immune status, antepartum on their problem list.  Patient presents today with fatigue.  Patient endorses fetal movement. Patient endorses abdominal cramping and contractions.  She reports it is intermittent in nature that lasts between one to two hours, but it happens in "batches." She also reports having a HA, last week, in her temporal area with some blurry vision.  Patient states she did not treat the headache.  Patient denies vaginal concerns including abnormal discharge, leaking of fluid, and bleeding.  Contractions: Not present. Vag. Bleeding: None.  Movement: Present.  Reviewed past medical,surgical, social, obstetrical and family history as well as problem list, medications and allergies.  Objective   Vitals:   09/26/20 0934  BP: (!) 99/58  Pulse: 92  Weight: 185 lb (83.9 kg)  Body mass index is 29.38 kg/m.  Total Weight Gain:15 lb (6.804 kg)         Physical Examination:   General appearance: Well appearing, and in no distress  Mental status: Alert, oriented to person, place, and time  Skin: Warm & dry  Cardiovascular: Normal heart rate noted  Respiratory: Normal respiratory effort, no distress  Abdomen: Soft, gravid, nontender, AGA with Fundal Height: 32 cm  Pelvic: Cervical exam deferred           Extremities: Edema: Trace  Fetal Status: Fetal Heart Rate (bpm): 141  Movement: Present   No results found for this or any previous visit (from the past 24 hour(s)).   Assessment & Plan:  High-risk pregnancy of a 30 y.o., G3P1011 at [redacted]w[redacted]d with an Estimated Date of Delivery: 11/18/20   1. Supervision of other normal pregnancy, antepartum -Anticipatory guidance for upcoming appts. -Patient to schedule next appt in 2-3 weeks for an in-person visit.  2. [redacted] weeks gestation of pregnancy -Doing well. -Instructed to take tylenol for headaches.  Informed that HA at temporal area is usually stress induced. -Instructed to report to [facility] for headaches or other symptoms not relieved by tylenol.   3. 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 Patient aware. -Questioned if okay to have female provider and she states as long as he is good! -Message sent to J. Battle with Dr. Manus Rudd cc'd for scheduling of C/S at or after 37 weeks. -Patient for repeat US on August 8th-Patient aware  4. Language barrier, cultural differences -Interpretations completed with assistance of in-person interpreter.  5. Rubella non-immune status, antepartum -Plan to receive in PPP      Meds: No orders of the defined types were placed in this encounter.  Labs/procedures today:  Lab Orders  No laboratory test(s) ordered today     Reviewed: Preterm labor symptoms and general obstetric precautions including but not limited to vaginal bleeding, contractions, leaking of fluid and fetal movement were reviewed in detail with the patient.  All questions were answered.  Follow-up: Return in about 2 weeks (around 10/10/2020) for HROB.  No orders of the defined types were placed in this encounter.  Cherre Robins MSN, CNM 09/26/2020

## 2020-09-27 ENCOUNTER — Other Ambulatory Visit (HOSPITAL_BASED_OUTPATIENT_CLINIC_OR_DEPARTMENT_OTHER): Payer: Self-pay

## 2020-10-09 ENCOUNTER — Ambulatory Visit: Payer: Medicaid Other

## 2020-10-09 ENCOUNTER — Encounter: Payer: Medicaid Other | Admitting: Obstetrics & Gynecology

## 2020-10-10 ENCOUNTER — Ambulatory Visit: Payer: Medicaid Other | Attending: Obstetrics and Gynecology | Admitting: *Deleted

## 2020-10-10 ENCOUNTER — Other Ambulatory Visit: Payer: Self-pay

## 2020-10-10 ENCOUNTER — Ambulatory Visit (HOSPITAL_BASED_OUTPATIENT_CLINIC_OR_DEPARTMENT_OTHER): Payer: Medicaid Other

## 2020-10-10 ENCOUNTER — Encounter: Payer: Self-pay | Admitting: *Deleted

## 2020-10-10 VITALS — BP 116/57 | HR 79

## 2020-10-10 DIAGNOSIS — O09293 Supervision of pregnancy with other poor reproductive or obstetric history, third trimester: Secondary | ICD-10-CM | POA: Insufficient documentation

## 2020-10-10 DIAGNOSIS — O09899 Supervision of other high risk pregnancies, unspecified trimester: Secondary | ICD-10-CM

## 2020-10-10 DIAGNOSIS — Z3A34 34 weeks gestation of pregnancy: Secondary | ICD-10-CM | POA: Insufficient documentation

## 2020-10-10 DIAGNOSIS — Z362 Encounter for other antenatal screening follow-up: Secondary | ICD-10-CM

## 2020-10-10 DIAGNOSIS — Z2839 Other underimmunization status: Secondary | ICD-10-CM

## 2020-10-10 DIAGNOSIS — Z3480 Encounter for supervision of other normal pregnancy, unspecified trimester: Secondary | ICD-10-CM

## 2020-10-10 NOTE — Progress Notes (Signed)
C/o daily h/a since 32 wks, not relieved by Tylenol; advised to report to [facility] MD.

## 2020-10-11 ENCOUNTER — Telehealth: Payer: Self-pay

## 2020-10-11 NOTE — Telephone Encounter (Signed)
Pt's husband called stating the pt has been having headaches for 1 week. Pt's husband states she took 1 500 mg tylenol but it provided no relief. I advised pt's husband to take her to [facility] if 2 500 mg tylenol doesn't provide any relief. Understanding was voiced. Maureen Delatte l Nieshia Larmon, CMA

## 2020-10-16 ENCOUNTER — Ambulatory Visit (INDEPENDENT_AMBULATORY_CARE_PROVIDER_SITE_OTHER): Payer: Medicaid Other | Admitting: Obstetrics & Gynecology

## 2020-10-16 ENCOUNTER — Encounter: Payer: Self-pay | Admitting: Obstetrics & Gynecology

## 2020-10-16 ENCOUNTER — Other Ambulatory Visit: Payer: Self-pay

## 2020-10-16 VITALS — BP 109/57 | HR 86 | Wt 188.0 lb

## 2020-10-16 DIAGNOSIS — Z3480 Encounter for supervision of other normal pregnancy, unspecified trimester: Secondary | ICD-10-CM

## 2020-10-16 DIAGNOSIS — Z98891 History of uterine scar from previous surgery: Secondary | ICD-10-CM

## 2020-10-16 DIAGNOSIS — Z2839 Other underimmunization status: Secondary | ICD-10-CM

## 2020-10-16 DIAGNOSIS — Z3A35 35 weeks gestation of pregnancy: Secondary | ICD-10-CM

## 2020-10-16 DIAGNOSIS — Z603 Acculturation difficulty: Secondary | ICD-10-CM

## 2020-10-16 LAB — POCT URINALYSIS DIPSTICK OB
Glucose, UA: NEGATIVE
POC,PROTEIN,UA: NEGATIVE

## 2020-10-16 NOTE — Progress Notes (Signed)
   PRENATAL VISIT NOTE  Subjective:  Dawn Bullock is a 30 y.o. G3P1011 at [redacted]w[redacted]d being seen today for ongoing prenatal care.  She is currently monitored for the following issues for this low-risk pregnancy and has Language barrier, cultural differences; History of uterine scar due to previous surgery; Supervision of other normal pregnancy, antepartum; and Rubella non-immune status, antepartum on their problem list.  Patient reports  daily HA for > 1 month. Pt had some HA prior to preg. She reports that her vision has changed. She cannot see the writing on the door. She has tried to make an appt on the list provided for her.    Contractions: Not present. Vag. Bleeding: None.  Movement: Present. Denies leaking of fluid.   The following portions of the patient's history were reviewed and updated as appropriate: allergies, current medications, past family history, past medical history, past social history, past surgical history and problem list.   Objective:   Vitals:   10/16/20 1054  BP: (!) 109/57  Pulse: 86  Weight: 188 lb (85.3 kg)    Fetal Status: Fetal Heart Rate (bpm): 125   Movement: Present     General:  Alert, oriented and cooperative. Patient is in no acute distress.  Skin: Skin is warm and dry. No rash noted.   Cardiovascular: Normal heart rate noted  Respiratory: Normal respiratory effort, no problems with respiration noted  Abdomen: Soft, gravid, appropriate for gestational age.  Pain/Pressure: Present     Pelvic: Cervical exam deferred        Extremities: Normal range of motion.  Edema: Trace  Mental Status: Normal mood and affect. Normal behavior. Normal judgment and thought content.   Assessment and Plan:  Pregnancy: G3P1011 at [redacted]w[redacted]d 1. Supervision of other normal pregnancy, antepartum FH and FHR WNL  2. Rubella non-immune status, antepartum Needs vac PP  3. [redacted] weeks gestation of pregnancy  4. 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 Adrian Blackwater. Pt confirmed that she is aware that he is a female and she is fine with that.    5. Language barrier, cultural differences Pt has an in person interpreter with her this visit.    6. HA  BP is WNL UA - neg for protein Pt will go to ger her visit checked.    Preterm labor symptoms and general obstetric precautions including but not limited to vaginal bleeding, contractions, leaking of fluid and fetal movement were reviewed in detail with the patient. Please refer to After Visit Summary for other counseling recommendations.   Return in about 2 weeks (around 10/30/2020).  Future Appointments  Date Time Provider Department Center  10/24/2020  9:15 AM Aviva Signs, CNM [facility] None  11/02/2020  3:50 PM Constant, Gigi Gin, MD [facility] None  11/06/2020  9:15 AM Milas Hock, MD [facility] None    Willodean Rosenthal, MD

## 2020-10-16 NOTE — Addendum Note (Signed)
Addended by: Anell Barr on: 10/16/2020 11:34 AM   Modules accepted: Orders

## 2020-10-17 ENCOUNTER — Encounter (HOSPITAL_COMMUNITY): Payer: Self-pay

## 2020-10-17 NOTE — Patient Instructions (Signed)
Suellyn Najmuldee Marullo  10/17/2020   Your procedure is scheduled on:  10/31/2020  Arrive at 1030 at Entrance C on [employer] at [facility]  and [employer]. You are invited to use the FREE valet parking or use the Visitor's parking deck.  Pick up the phone at the desk and dial [phone].  Call this number if you have problems the morning of surgery: [phone]  Remember:   Do not eat food:(After Midnight) Desps de medianoche.  Do not drink clear liquids: (After Midnight) Desps de medianoche.  Take these medicines the morning of surgery with A SIP OF WATER:  none   Do not wear jewelry, make-up or nail polish.  Do not wear lotions, powders, or perfumes. Do not wear deodorant.  Do not shave 48 hours prior to surgery.  Do not bring valuables to the hospital.  [facility] is not   responsible for any belongings or valuables brought to the hospital.  Contacts, dentures or bridgework may not be worn into surgery.  Leave suitcase in the car. After surgery it may be brought to your room.  For patients admitted to the hospital, checkout time is 11:00 AM the day of              discharge.      Please read over the following fact sheets that you were given:     Preparing for Surgery

## 2020-10-17 NOTE — Pre-Procedure Instructions (Signed)
Interpreter number [id number] 

## 2020-10-24 ENCOUNTER — Other Ambulatory Visit (HOSPITAL_COMMUNITY)
Admission: RE | Admit: 2020-10-24 | Discharge: 2020-10-24 | Disposition: A | Discharge status: Home / Self Care | Payer: Medicaid Other | Source: Ambulatory Visit | Attending: Advanced Practice Midwife | Admitting: Advanced Practice Midwife

## 2020-10-24 ENCOUNTER — Encounter: Payer: Self-pay | Admitting: Advanced Practice Midwife

## 2020-10-24 ENCOUNTER — Ambulatory Visit (INDEPENDENT_AMBULATORY_CARE_PROVIDER_SITE_OTHER): Payer: Medicaid Other | Admitting: Advanced Practice Midwife

## 2020-10-24 ENCOUNTER — Other Ambulatory Visit: Payer: Self-pay

## 2020-10-24 VITALS — BP 102/57 | HR 91 | Wt 190.0 lb

## 2020-10-24 DIAGNOSIS — Z603 Acculturation difficulty: Secondary | ICD-10-CM

## 2020-10-24 DIAGNOSIS — Z3483 Encounter for supervision of other normal pregnancy, third trimester: Secondary | ICD-10-CM | POA: Diagnosis not present

## 2020-10-24 DIAGNOSIS — Z3A36 36 weeks gestation of pregnancy: Secondary | ICD-10-CM

## 2020-10-24 NOTE — Progress Notes (Signed)
   PRENATAL VISIT NOTE  Subjective:  Dawn Bullock Fear is a 30 y.o. G3P1011 at [redacted]w[redacted]d being seen today for ongoing prenatal care.  She is currently monitored for the following issues for this low-risk pregnancy and has Language barrier, cultural differences; History of uterine scar due to previous surgery; Supervision of other normal pregnancy, antepartum; and Rubella non-immune status, antepartum on their problem list.  Live interpretor present Patient reports no complaints.  Contractions: Irritability. Vag. Bleeding: None.  Movement: Present. Denies leaking of fluid.   The following portions of the patient's history were reviewed and updated as appropriate: allergies, current medications, past family history, past medical history, past social history, past surgical history and problem list.   Objective:   Vitals:   10/24/20 0923  BP: (!) 102/57  Pulse: 91  Weight: 190 lb (86.2 kg)    Fetal Status: Fetal Heart Rate (bpm): 145   Movement: Present     General:  Alert, oriented and cooperative. Patient is in no acute distress.  Skin: Skin is warm and dry. No rash noted.   Cardiovascular: Normal heart rate noted  Respiratory: Normal respiratory effort, no problems with respiration noted  Abdomen: Soft, gravid, appropriate for gestational age.  Pain/Pressure: Present     Pelvic: Cervical exam performed in the presence of a chaperone        Extremities: Normal range of motion.  Edema: Trace  Mental Status: Normal mood and affect. Normal behavior. Normal judgment and thought content.   Assessment and Plan:  Pregnancy: G3P1011 at [redacted]w[redacted]d 1. [redacted] weeks gestation of pregnancy  - Culture, beta strep (group b only) - GC/Chlamydia probe amp ([place])not at [facility]      C/S scheduled for next week   Patient asking why she cannot have vaginal delivery             Discussed risk of rupture with history of cornual ectopic removal in 2020.  2. Language barrier, cultural differences     Interpretor present  Preterm labor symptoms and general obstetric precautions including but not limited to vaginal bleeding, contractions, leaking of fluid and fetal movement were reviewed in detail with the patient. Please refer to After Visit Summary for other counseling recommendations.   Return in about 1 week (around 10/31/2020) for [facility] OR.  Future Appointments  Date Time Provider Department Center  10/30/2020 10:00 AM [facility] PAT 1 [facility] None  11/06/2020  9:15 AM Milas Hock, MD [facility] None    Wynelle Bourgeois, CNM

## 2020-10-24 NOTE — Progress Notes (Signed)
Patient presents for OB visit with 36 week cultures. Armandina Stammer RN

## 2020-10-25 LAB — GC/CHLAMYDIA PROBE AMP (~~LOC~~) NOT AT ARMC
Chlamydia: NEGATIVE
Comment: NEGATIVE
Comment: NORMAL
Neisseria Gonorrhea: NEGATIVE

## 2020-10-28 LAB — CULTURE, BETA STREP (GROUP B ONLY): Strep Gp B Culture: NEGATIVE

## 2020-10-30 ENCOUNTER — Encounter (HOSPITAL_COMMUNITY): Payer: Self-pay | Admitting: Family Medicine

## 2020-10-30 ENCOUNTER — Other Ambulatory Visit: Payer: Self-pay

## 2020-10-30 ENCOUNTER — Encounter (HOSPITAL_COMMUNITY)
Admission: RE | Admit: 2020-10-30 | Discharge: 2020-10-30 | Disposition: A | Discharge status: Home / Self Care | Payer: Medicaid Other | Source: Ambulatory Visit | Attending: Family Medicine | Admitting: Family Medicine

## 2020-10-30 ENCOUNTER — Other Ambulatory Visit: Payer: Self-pay | Admitting: Family Medicine

## 2020-10-30 DIAGNOSIS — Z01812 Encounter for preprocedural laboratory examination: Secondary | ICD-10-CM | POA: Insufficient documentation

## 2020-10-30 LAB — COMPREHENSIVE METABOLIC PANEL
ALT: 10 U/L (ref 0–44)
AST: 16 U/L (ref 15–41)
Albumin: 2.7 g/dL — ABNORMAL LOW (ref 3.5–5.0)
Alkaline Phosphatase: 149 U/L — ABNORMAL HIGH (ref 38–126)
Anion gap: 8 (ref 5–15)
BUN: 6 mg/dL (ref 6–20)
CO2: 25 mmol/L (ref 22–32)
Calcium: 8.8 mg/dL — ABNORMAL LOW (ref 8.9–10.3)
Chloride: 104 mmol/L (ref 98–111)
Creatinine, Ser: 0.44 mg/dL (ref 0.44–1.00)
GFR, Estimated: >60 mL/min (ref >60)
Glucose, Bld: 112 mg/dL — ABNORMAL HIGH (ref 70–99)
Potassium: 3.7 mmol/L (ref 3.5–5.1)
Sodium: 137 mmol/L (ref 135–145)
Total Bilirubin: 0.5 mg/dL (ref 0.3–1.2)
Total Protein: 6.3 g/dL — ABNORMAL LOW (ref 6.5–8.1)

## 2020-10-30 LAB — CBC
HCT: 32 % — ABNORMAL LOW (ref 36.0–46.0)
Hemoglobin: 10.3 g/dL — ABNORMAL LOW (ref 12.0–15.0)
MCH: 28.5 pg (ref 26.0–34.0)
MCHC: 32.2 g/dL (ref 30.0–36.0)
MCV: 88.6 fL (ref 80.0–100.0)
Platelets: 212 10*3/uL (ref 150–400)
RBC: 3.61 MIL/uL — ABNORMAL LOW (ref 3.87–5.11)
RDW: 12.6 % (ref 11.5–15.5)
WBC: 9.7 10*3/uL (ref 4.0–10.5)
nRBC: 0 % (ref 0.0–0.2)

## 2020-10-30 LAB — TYPE AND SCREEN
ABO/RH(D): B POS
Antibody Screen: NEGATIVE

## 2020-10-30 LAB — SARS CORONAVIRUS 2 (TAT 6-24 HRS): SARS Coronavirus 2: NEGATIVE

## 2020-10-30 NOTE — Anesthesia Preprocedure Evaluation (Addendum)
Anesthesia Evaluation  Patient identified by MRN, date of birth, ID band Patient awake    Reviewed: Allergy & Precautions, NPO status , Patient's Chart, lab work & pertinent test results  Airway Mallampati: II  TM Distance: >3 FB Neck ROM: Full    Dental no notable dental hx. (+) Teeth Intact, Dental Advisory Given   Pulmonary neg pulmonary ROS,    Pulmonary exam normal breath sounds clear to auscultation       Cardiovascular Exercise Tolerance: Good Normal cardiovascular exam Rhythm:Regular Rate:Normal     Neuro/Psych negative neurological ROS     GI/Hepatic negative GI ROS, Neg liver ROS,   Endo/Other  negative endocrine ROS  Renal/GU negative Renal ROS     Musculoskeletal negative musculoskeletal ROS (+)   Abdominal   Peds  Hematology Lab Results      Component                Value               Date                      WBC                      9.7                 10/30/2020                HGB                      10.3 (L)            10/30/2020                HCT                      32.0 (L)            10/30/2020                MCV                      88.6                10/30/2020                PLT                      212                 10/30/2020            Previous uterine surgery  T & S available   Anesthesia Other Findings   Reproductive/Obstetrics (+) Pregnancy                            Anesthesia Physical Anesthesia Plan  ASA: 2  Anesthesia Plan: Spinal   Post-op Pain Management:    Induction:   PONV Risk Score and Plan: 3 and Treatment may vary due to age or medical condition  Airway Management Planned: Natural Airway and Nasal Cannula  Additional Equipment: None  Intra-op Plan:   Post-operative Plan:   Informed Consent: I have reviewed the patients History and Physical, chart, labs and discussed the procedure including the risks, benefits and  alternatives for the proposed anesthesia with the patient or authorized representative who has  indicated his/her understanding and acceptance.     Dental advisory given and Interpreter used for interveiw  Plan Discussed with:   Anesthesia Plan Comments: (37.3 wk G3P1 for Primary C/S under spinal arabic interpreter)       Anesthesia Quick Evaluation

## 2020-10-31 ENCOUNTER — Encounter (HOSPITAL_COMMUNITY): Payer: Self-pay | Admitting: Family Medicine

## 2020-10-31 ENCOUNTER — Other Ambulatory Visit: Payer: Self-pay

## 2020-10-31 ENCOUNTER — Inpatient Hospital Stay (HOSPITAL_COMMUNITY): Payer: Medicaid Other | Admitting: Anesthesiology

## 2020-10-31 ENCOUNTER — Encounter (HOSPITAL_COMMUNITY)
Admission: RE | Disposition: A | Discharge status: Home / Self Care | Payer: Self-pay | Source: Home / Self Care | Attending: Family Medicine

## 2020-10-31 ENCOUNTER — Inpatient Hospital Stay (HOSPITAL_COMMUNITY)
Admission: RE | Admit: 2020-10-31 | Discharge: 2020-11-03 | DRG: 787 | Disposition: A | Discharge status: Home / Self Care | Payer: Medicaid Other | Attending: Family Medicine | Admitting: Family Medicine

## 2020-10-31 ENCOUNTER — Encounter: Payer: Medicaid Other | Admitting: Advanced Practice Midwife

## 2020-10-31 DIAGNOSIS — D62 Acute posthemorrhagic anemia: Secondary | ICD-10-CM | POA: Diagnosis not present

## 2020-10-31 DIAGNOSIS — R519 Headache, unspecified: Secondary | ICD-10-CM | POA: Diagnosis not present

## 2020-10-31 DIAGNOSIS — Z98891 History of uterine scar from previous surgery: Secondary | ICD-10-CM

## 2020-10-31 DIAGNOSIS — O99893 Other specified diseases and conditions complicating puerperium: Secondary | ICD-10-CM | POA: Diagnosis not present

## 2020-10-31 DIAGNOSIS — O0081 Other ectopic pregnancy with intrauterine pregnancy: Secondary | ICD-10-CM | POA: Diagnosis not present

## 2020-10-31 DIAGNOSIS — O26893 Other specified pregnancy related conditions, third trimester: Principal | ICD-10-CM | POA: Diagnosis present

## 2020-10-31 DIAGNOSIS — O9081 Anemia of the puerperium: Secondary | ICD-10-CM | POA: Diagnosis not present

## 2020-10-31 DIAGNOSIS — O34219 Maternal care for unspecified type scar from previous cesarean delivery: Secondary | ICD-10-CM | POA: Diagnosis present

## 2020-10-31 DIAGNOSIS — Z3A37 37 weeks gestation of pregnancy: Secondary | ICD-10-CM | POA: Diagnosis not present

## 2020-10-31 DIAGNOSIS — O3429 Maternal care due to uterine scar from other previous surgery: Secondary | ICD-10-CM | POA: Diagnosis not present

## 2020-10-31 LAB — RPR: RPR Ser Ql: NONREACTIVE

## 2020-10-31 SURGERY — Surgical Case
Anesthesia: Spinal

## 2020-10-31 MED ORDER — PRENATAL MULTIVITAMIN CH
1.0000 | ORAL_TABLET | Freq: Every day | ORAL | Status: DC
  Administered 2020-11-01 – 2020-11-03 (×3): 1 via ORAL
  Filled 2020-10-31 (×3): qty 1

## 2020-10-31 MED ORDER — NALOXONE HCL 0.4 MG/ML IJ SOLN: 0.4000 mg | INTRAMUSCULAR | Status: DC | PRN

## 2020-10-31 MED ORDER — ACETAMINOPHEN 500 MG PO TABS
1000.0000 mg | ORAL_TABLET | Freq: Four times a day (QID) | ORAL | Status: DC
  Administered 2020-10-31 – 2020-11-03 (×9): 1000 mg via ORAL
  Filled 2020-10-31 (×12): qty 2

## 2020-10-31 MED ORDER — ONDANSETRON HCL 4 MG/2ML IJ SOLN: 4.0000 mg | Freq: Three times a day (TID) | INTRAMUSCULAR | Status: DC | PRN

## 2020-10-31 MED ORDER — SCOPOLAMINE 1 MG/3DAYS TD PT72: 1.0000 | MEDICATED_PATCH | Freq: Once | TRANSDERMAL | Status: DC

## 2020-10-31 MED ORDER — IBUPROFEN 600 MG PO TABS
600.0000 mg | ORAL_TABLET | Freq: Four times a day (QID) | ORAL | Status: DC
  Administered 2020-11-01 – 2020-11-03 (×7): 600 mg via ORAL
  Filled 2020-10-31 (×8): qty 1

## 2020-10-31 MED ORDER — PHENYLEPHRINE HCL-NACL 20-0.9 MG/250ML-% IV SOLN
INTRAVENOUS | Status: DC | PRN
  Administered 2020-10-31: 60 ug/min via INTRAVENOUS

## 2020-10-31 MED ORDER — NALBUPHINE HCL 10 MG/ML IJ SOLN: 5.0000 mg | Freq: Once | INTRAMUSCULAR | Status: DC | PRN

## 2020-10-31 MED ORDER — BUPIVACAINE IN DEXTROSE 0.75-8.25 % IT SOLN
INTRATHECAL | Status: AC
  Filled 2020-10-31: qty 2

## 2020-10-31 MED ORDER — WITCH HAZEL-GLYCERIN EX PADS: 1.0000 "application " | MEDICATED_PAD | CUTANEOUS | Status: DC | PRN

## 2020-10-31 MED ORDER — SOD CITRATE-CITRIC ACID 500-334 MG/5ML PO SOLN
ORAL | Status: AC
  Filled 2020-10-31: qty 30

## 2020-10-31 MED ORDER — NALBUPHINE HCL 10 MG/ML IJ SOLN: 5.0000 mg | INTRAMUSCULAR | Status: DC | PRN

## 2020-10-31 MED ORDER — OXYTOCIN-SODIUM CHLORIDE 30-0.9 UT/500ML-% IV SOLN
INTRAVENOUS | Status: AC
  Filled 2020-10-31: qty 1000

## 2020-10-31 MED ORDER — SOD CITRATE-CITRIC ACID 500-334 MG/5ML PO SOLN
30.0000 mL | ORAL | Status: AC
Start: 2020-10-31 — End: 2020-10-31
  Administered 2020-10-31: 30 mL via ORAL

## 2020-10-31 MED ORDER — DIPHENHYDRAMINE HCL 25 MG PO CAPS
25.0000 mg | ORAL_CAPSULE | Freq: Four times a day (QID) | ORAL | Status: DC | PRN
  Administered 2020-11-01: 25 mg via ORAL

## 2020-10-31 MED ORDER — SENNOSIDES-DOCUSATE SODIUM 8.6-50 MG PO TABS
2.0000 | ORAL_TABLET | ORAL | Status: DC
  Administered 2020-11-01 – 2020-11-03 (×3): 2 via ORAL
  Filled 2020-10-31 (×3): qty 2

## 2020-10-31 MED ORDER — OXYCODONE HCL 5 MG PO TABS: 5.0000 mg | ORAL_TABLET | Freq: Once | ORAL | Status: DC | PRN

## 2020-10-31 MED ORDER — DIPHENHYDRAMINE HCL 25 MG PO CAPS
25.0000 mg | ORAL_CAPSULE | ORAL | Status: DC | PRN
  Filled 2020-10-31: qty 1

## 2020-10-31 MED ORDER — KETOROLAC TROMETHAMINE 30 MG/ML IJ SOLN: 30.0000 mg | Freq: Once | INTRAMUSCULAR | Status: DC | PRN

## 2020-10-31 MED ORDER — OXYCODONE HCL 5 MG/5ML PO SOLN: 5.0000 mg | Freq: Once | ORAL | Status: DC | PRN

## 2020-10-31 MED ORDER — KETOROLAC TROMETHAMINE 30 MG/ML IJ SOLN
30.0000 mg | Freq: Four times a day (QID) | INTRAMUSCULAR | Status: AC
  Administered 2020-10-31 – 2020-11-01 (×3): 30 mg via INTRAVENOUS
  Filled 2020-10-31 (×3): qty 1

## 2020-10-31 MED ORDER — NALOXONE HCL 4 MG/10ML IJ SOLN
1.0000 ug/kg/h | INTRAVENOUS | Status: DC | PRN
  Filled 2020-10-31: qty 5

## 2020-10-31 MED ORDER — PHENYLEPHRINE HCL (PRESSORS) 10 MG/ML IV SOLN
INTRAVENOUS | Status: DC | PRN
  Administered 2020-10-31: 40 ug via INTRAVENOUS

## 2020-10-31 MED ORDER — DIBUCAINE (PERIANAL) 1 % EX OINT: 1.0000 "application " | TOPICAL_OINTMENT | CUTANEOUS | Status: DC | PRN

## 2020-10-31 MED ORDER — SODIUM CHLORIDE 0.9 % IR SOLN
Status: DC | PRN
  Administered 2020-10-31: 1

## 2020-10-31 MED ORDER — SODIUM CHLORIDE 0.9% FLUSH: 3.0000 mL | INTRAVENOUS | Status: DC | PRN

## 2020-10-31 MED ORDER — COCONUT OIL OIL: 1.0000 "application " | TOPICAL_OIL | Status: DC | PRN

## 2020-10-31 MED ORDER — CEFAZOLIN SODIUM-DEXTROSE 2-4 GM/100ML-% IV SOLN
2.0000 g | INTRAVENOUS | Status: AC
  Administered 2020-10-31: 2 g via INTRAVENOUS

## 2020-10-31 MED ORDER — OXYTOCIN-SODIUM CHLORIDE 30-0.9 UT/500ML-% IV SOLN
INTRAVENOUS | Status: DC | PRN
  Administered 2020-10-31: 30 [IU] via INTRAVENOUS

## 2020-10-31 MED ORDER — OXYTOCIN-SODIUM CHLORIDE 30-0.9 UT/500ML-% IV SOLN: 2.5000 [IU]/h | INTRAVENOUS | Status: AC

## 2020-10-31 MED ORDER — OXYCODONE HCL 5 MG PO TABS
5.0000 mg | ORAL_TABLET | ORAL | Status: DC | PRN
  Administered 2020-11-01 – 2020-11-02 (×3): 5 mg via ORAL
  Filled 2020-10-31 (×3): qty 1

## 2020-10-31 MED ORDER — ONDANSETRON HCL 4 MG/2ML IJ SOLN
INTRAMUSCULAR | Status: DC | PRN
  Administered 2020-10-31: 4 mg via INTRAVENOUS

## 2020-10-31 MED ORDER — CEFAZOLIN SODIUM-DEXTROSE 2-4 GM/100ML-% IV SOLN
INTRAVENOUS | Status: AC
  Filled 2020-10-31: qty 100

## 2020-10-31 MED ORDER — KETOROLAC TROMETHAMINE 30 MG/ML IJ SOLN
INTRAMUSCULAR | Status: AC
  Filled 2020-10-31: qty 1

## 2020-10-31 MED ORDER — POVIDONE-IODINE 10 % EX SWAB
2.0000 "application " | Freq: Once | CUTANEOUS | Status: AC
  Administered 2020-10-31: 2 via TOPICAL

## 2020-10-31 MED ORDER — KETOROLAC TROMETHAMINE 30 MG/ML IJ SOLN
30.0000 mg | Freq: Once | INTRAMUSCULAR | Status: AC
  Administered 2020-10-31: 30 mg via INTRAVENOUS

## 2020-10-31 MED ORDER — MORPHINE SULFATE (PF) 0.5 MG/ML IJ SOLN
INTRAMUSCULAR | Status: AC
  Filled 2020-10-31: qty 10

## 2020-10-31 MED ORDER — PHENYLEPHRINE HCL-NACL 20-0.9 MG/250ML-% IV SOLN
INTRAVENOUS | Status: AC
  Filled 2020-10-31: qty 250

## 2020-10-31 MED ORDER — FENTANYL CITRATE (PF) 100 MCG/2ML IJ SOLN
INTRAMUSCULAR | Status: DC | PRN
  Administered 2020-10-31: 15 ug via INTRATHECAL

## 2020-10-31 MED ORDER — FENTANYL CITRATE (PF) 100 MCG/2ML IJ SOLN
INTRAMUSCULAR | Status: AC
  Filled 2020-10-31: qty 2

## 2020-10-31 MED ORDER — ACETAMINOPHEN 10 MG/ML IV SOLN
INTRAVENOUS | Status: AC
  Filled 2020-10-31: qty 100

## 2020-10-31 MED ORDER — MENTHOL 3 MG MT LOZG: 1.0000 | LOZENGE | OROMUCOSAL | Status: DC | PRN

## 2020-10-31 MED ORDER — DIPHENHYDRAMINE HCL 50 MG/ML IJ SOLN: 12.5000 mg | INTRAMUSCULAR | Status: DC | PRN

## 2020-10-31 MED ORDER — HYDROMORPHONE HCL 1 MG/ML IJ SOLN: 0.2500 mg | INTRAMUSCULAR | Status: DC | PRN

## 2020-10-31 MED ORDER — ONDANSETRON HCL 4 MG/2ML IJ SOLN: 4.0000 mg | Freq: Once | INTRAMUSCULAR | Status: DC | PRN

## 2020-10-31 MED ORDER — SIMETHICONE 80 MG PO CHEW
80.0000 mg | CHEWABLE_TABLET | Freq: Three times a day (TID) | ORAL | Status: DC
  Administered 2020-10-31 – 2020-11-03 (×7): 80 mg via ORAL
  Filled 2020-10-31 (×9): qty 1

## 2020-10-31 MED ORDER — ACETAMINOPHEN 10 MG/ML IV SOLN
INTRAVENOUS | Status: DC | PRN
  Administered 2020-10-31: 1000 mg via INTRAVENOUS

## 2020-10-31 MED ORDER — LACTATED RINGERS IV SOLN: INTRAVENOUS | Status: DC

## 2020-10-31 MED ORDER — SIMETHICONE 80 MG PO CHEW: 80.0000 mg | CHEWABLE_TABLET | ORAL | Status: DC | PRN

## 2020-10-31 MED ORDER — STERILE WATER FOR IRRIGATION IR SOLN
Status: DC | PRN
  Administered 2020-10-31: 1

## 2020-10-31 MED ORDER — BUPIVACAINE IN DEXTROSE 0.75-8.25 % IT SOLN
INTRATHECAL | Status: DC | PRN
  Administered 2020-10-31: 12 mL via INTRATHECAL

## 2020-10-31 MED ORDER — PHENYLEPHRINE 40 MCG/ML (10ML) SYRINGE FOR IV PUSH (FOR BLOOD PRESSURE SUPPORT)
PREFILLED_SYRINGE | INTRAVENOUS | Status: AC
  Filled 2020-10-31: qty 20

## 2020-10-31 MED ORDER — TETANUS-DIPHTH-ACELL PERTUSSIS 5-2.5-18.5 LF-MCG/0.5 IM SUSY: 0.5000 mL | PREFILLED_SYRINGE | Freq: Once | INTRAMUSCULAR | Status: DC

## 2020-10-31 MED ORDER — DEXAMETHASONE SODIUM PHOSPHATE 4 MG/ML IJ SOLN
INTRAMUSCULAR | Status: DC | PRN
  Administered 2020-10-31: 10 mg via INTRAVENOUS

## 2020-10-31 MED ORDER — MORPHINE SULFATE (PF) 0.5 MG/ML IJ SOLN
INTRAMUSCULAR | Status: DC | PRN
  Administered 2020-10-31: 150 ug via INTRATHECAL

## 2020-10-31 MED ORDER — LACTATED RINGERS IV SOLN: INTRAVENOUS | Status: DC | PRN

## 2020-10-31 MED ORDER — ZOLPIDEM TARTRATE 5 MG PO TABS
5.0000 mg | ORAL_TABLET | Freq: Every evening | ORAL | Status: DC | PRN
  Administered 2020-10-31: 5 mg via ORAL
  Filled 2020-10-31: qty 1

## 2020-10-31 MED ORDER — ONDANSETRON HCL 4 MG/2ML IJ SOLN
INTRAMUSCULAR | Status: AC
  Filled 2020-10-31: qty 2

## 2020-10-31 SURGICAL SUPPLY — 32 items
BENZOIN TINCTURE PRP APPL 2/3 (GAUZE/BANDAGES/DRESSINGS) ×2 IMPLANT
CHLORAPREP W/TINT 26ML (MISCELLANEOUS) ×2 IMPLANT
CLAMP CORD UMBIL (MISCELLANEOUS) IMPLANT
CLOTH BEACON ORANGE TIMEOUT ST (SAFETY) ×2 IMPLANT
DERMABOND ADVANCED (GAUZE/BANDAGES/DRESSINGS) ×1
DERMABOND ADVANCED .7 DNX12 (GAUZE/BANDAGES/DRESSINGS) ×1 IMPLANT
DRSG OPSITE POSTOP 4X10 (GAUZE/BANDAGES/DRESSINGS) ×2 IMPLANT
ELECT REM PT RETURN 9FT ADLT (ELECTROSURGICAL) ×2
ELECTRODE REM PT RTRN 9FT ADLT (ELECTROSURGICAL) ×1 IMPLANT
EXTRACTOR VACUUM M CUP 4 TUBE (SUCTIONS) IMPLANT
GLOVE BIOGEL PI IND STRL 7.0 (GLOVE) ×2 IMPLANT
GLOVE BIOGEL PI IND STRL 7.5 (GLOVE) ×2 IMPLANT
GLOVE BIOGEL PI INDICATOR 7.0 (GLOVE) ×2
GLOVE BIOGEL PI INDICATOR 7.5 (GLOVE) ×2
GLOVE ECLIPSE 7.5 STRL STRAW (GLOVE) ×2 IMPLANT
GOWN STRL REUS W/TWL LRG LVL3 (GOWN DISPOSABLE) ×6 IMPLANT
KIT ABG SYR 3ML LUER SLIP (SYRINGE) IMPLANT
NEEDLE HYPO 25X5/8 SAFETYGLIDE (NEEDLE) IMPLANT
NS IRRIG 1000ML POUR BTL (IV SOLUTION) ×2 IMPLANT
PACK C SECTION WH (CUSTOM PROCEDURE TRAY) ×2 IMPLANT
PAD OB MATERNITY 4.3X12.25 (PERSONAL CARE ITEMS) ×2 IMPLANT
PENCIL SMOKE EVAC W/HOLSTER (ELECTROSURGICAL) ×2 IMPLANT
RTRCTR C-SECT PINK 25CM LRG (MISCELLANEOUS) ×2 IMPLANT
STRIP CLOSURE SKIN 1/2X4 (GAUZE/BANDAGES/DRESSINGS) ×2 IMPLANT
SUT VIC AB 0 CTX 36 (SUTURE) ×3
SUT VIC AB 0 CTX36XBRD ANBCTRL (SUTURE) ×3 IMPLANT
SUT VIC AB 2-0 CT1 27 (SUTURE) ×1
SUT VIC AB 2-0 CT1 TAPERPNT 27 (SUTURE) ×1 IMPLANT
SUT VIC AB 4-0 KS 27 (SUTURE) ×2 IMPLANT
TOWEL OR 17X24 6PK STRL BLUE (TOWEL DISPOSABLE) ×2 IMPLANT
TRAY FOLEY W/BAG SLVR 14FR LF (SET/KITS/TRAYS/PACK) ×2 IMPLANT
WATER STERILE IRR 1000ML POUR (IV SOLUTION) ×2 IMPLANT

## 2020-10-31 NOTE — H&P (Signed)
Faculty Practice H&P  Dawn Bullock is a 30 y.o. female G3P1011 with IUP at [redacted]w[redacted]d presenting for primary cesarean section for h/o of cornual ectopic pregnancy. Pregnancy was been complicated by rubella nonimmune.    Pt states she has been having no contractions, no vaginal bleeding, intact membranes, with normal fetal movement.     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  She plans to breastfeed, plans to bottle feed  Prenatal labs and studies: ABO, Rh: --/--/B POS (08/22 1108) Antibody: NEG (08/22 1108) Rubella: <0.90 (02/16 1636) RPR: NON REACTIVE (08/22 1113)  HBsAg: Negative (02/16 1636)  HIV: Non Reactive (06/21 0847)  GBS: Negative/-- (08/16 0950)  2hr Glucola: negative Genetic screening: normal Anatomy US: normal  Past Medical History:  Past Medical History:  Diagnosis Date   Hypotension    Medical history non-contributory     Past Surgical History:  Past Surgical History:  Procedure Laterality Date   CESAREAN SECTION  01/2019   EXPLORATORY LAPAROTOMY  09/22/2018   Exploratory Laparotomy and Hysterotomy with removal of Ectopic Pregnancy (N/A)   LAPAROTOMY N/A 09/22/2018   Procedure: Exploratory Laparotomy and Hysterotomy with removal of Ectopic Pregnancy;  Surgeon: Pocahontas Bing, MD;  Location: [facility] OR;  Service: Gynecology;  Laterality: N/A;   UNILATERAL SALPINGECTOMY Right 09/22/2018   Procedure: Unilateral Salpingectomy;  Surgeon:  Bing, MD;  Location[facility] OR;  Service: Gynecology;  Laterality: Right;    Obstetrical History:  OB History     Gravida  3    Para  1   Term  1   Preterm      AB  1   Living  1      SAB      IAB      Ectopic  1   Multiple      Live Births  1           Gynecological History:  OB History     Gravida  3   Para  1   Term  1   Preterm      AB  1   Living  1      SAB      IAB      Ectopic  1   Multiple      Live Births  1           Social History:  Social History   Socioeconomic History   Marital status: Married    Spouse name: Not on file   Number of children: Not on file   Years of education: Not on file   Highest education level: Not on file  Occupational History   Not on file  Tobacco Use   Smoking status: Never   Smokeless tobacco: Never  Vaping Use   Vaping Use: Never used  Substance and Sexual Activity   Alcohol use: Not Currently   Drug use: Not Currently   Sexual activity: Yes    Comment: had sex 3 weeks ago  Other Topics Concern   Not on file  Social History Narrative   Not on file   Social Determinants of Health   Financial  Resource Strain: Not on file  Food Insecurity: Not on file  Transportation Needs: Not on file  Physical Activity: Not on file  Stress: Not on file  Social Connections: Not on file    Family History: No family history on file.  Medications:  Prenatal vitamins,  Current Facility-Administered Medications  Medication Dose Route Frequency Provider Last Rate Last Admin   ceFAZolin (ANCEF) IVPB 2g/100 mL premix  2 g Intravenous On Call to OR Venora Maples, MD       sodium citrate-citric acid (ORACIT) solution 30 mL  30 mL Oral On Call to OR Venora Maples, MD        Allergies: No Known Allergies  Review of Systems: -  negative  Physical Exam: Blood pressure 128/68, pulse 91, temperature 98.1 F (36.7 C), temperature source Oral, resp. rate 17, height 5\' 6"  (1.676 m), weight 85.3 kg, last menstrual period 02/12/2020, SpO2 97 %, unknown if currently breastfeeding. GENERAL: Well-developed, well-nourished  female in no acute distress.  LUNGS: Clear to auscultation bilaterally.  HEART: Regular rate and rhythm. ABDOMEN: Soft, nontender, nondistended, gravid.  EXTREMITIES: Nontender, no edema, 2+ distal pulses. FHT:  Baseline rate 156 bpm    Pertinent Labs/Studies:   Lab Results  Component Value Date   WBC 9.7 10/30/2020   HGB 10.3 (L) 10/30/2020   HCT 32.0 (L) 10/30/2020   MCV 88.6 10/30/2020   PLT 212 10/30/2020    Assessment : Dawn Bullock is a 30 y.o. G3P1011 at [redacted]w[redacted]d being admitted for cesarean section secondary to cornual ectopic  Plan: The risks of cesarean section discussed with the patient included but were not limited to: bleeding which may require transfusion or reoperation; infection which may require antibiotics; injury to bowel, bladder, ureters or other surrounding organs; injury to the fetus; need for additional procedures including hysterectomy in the event of a life-threatening hemorrhage; placental abnormalities wth subsequent pregnancies, incisional problems, thromboembolic phenomenon and other postoperative/anesthesia complications. The patient concurred with the proposed plan, giving informed written consent for the procedure.   Patient has been NPO since last night and will remain NPO for procedure.  Preoperative prophylactic Ancef ordered on call to the OR.    [redacted]w[redacted]d, DO 10/31/2020, 11:57 AM

## 2020-10-31 NOTE — Op Note (Signed)
Dawn Bullock PROCEDURE DATE: 10/31/2020  PREOPERATIVE DIAGNOSIS: Intrauterine pregnancy at  [redacted]w[redacted]d weeks gestation;  prior interstitial pregnancy  POSTOPERATIVE DIAGNOSIS: The same  PROCEDURE: Primary Low Transverse Cesarean Section  SURGEON:  Dr. Candelaria Celeste  ASSISTANT: Dr Dorothyann Gibbs  INDICATIONS: Dawn Bullock is a 30 y.o. G3P1011 at [redacted]w[redacted]d scheduled for cesarean section secondary to  prior interstitial pregnancy .  The risks of cesarean section discussed with the patient included but were not limited to: bleeding which may require transfusion or reoperation; infection which may require antibiotics; injury to bowel, bladder, ureters or other surrounding organs; injury to the fetus; need for additional procedures including hysterectomy in the event of a life-threatening hemorrhage; placental abnormalities wth subsequent pregnancies, incisional problems, thromboembolic phenomenon and other postoperative/anesthesia complications. The patient concurred with the proposed plan, giving informed written consent for the procedure.    FINDINGS:  Viable female infant in vertex presentation.  Apgars 8 and 9, weight pending.  Clear amniotic fluid.  Intact placenta, three vessel cord.  Normal uterus, fallopian tubes and ovaries bilaterally. Descending colon mildly adhered to broad ligament.  ANESTHESIA:    Spinal INTRAVENOUS FLUIDS:1000 ml ESTIMATED BLOOD LOSS: 179 ml URINE OUTPUT:  200 ml SPECIMENS: Placenta sent to L&D COMPLICATIONS: None immediate  PROCEDURE IN DETAIL:  The patient received intravenous antibiotics and had sequential compression devices applied to her lower extremities while in the preoperative area.  She was then taken to the operating room where spinal anesthesia was administered and was found to be adequate. She was then placed in a dorsal supine position with a leftward tilt, and prepped and draped in a sterile manner.  A foley catheter was placed into her  bladder and attached to constant gravity, which drained clear fluid throughout.  After an adequate timeout was performed, a Pfannenstiel skin incision was made with scalpel and carried through to the underlying layer of fascia. The fascia was incised in the midline and this incision was extended bilaterally using the Mayo scissors. Kocher clamps were applied to the superior aspect of the fascial incision and the underlying rectus muscles were dissected off bluntly. A similar process was carried out on the inferior aspect of the facial incision. The rectus muscles were separated in the midline bluntly and the peritoneum was entered bluntly. An Alexis retractor was placed to aid in visualization of the uterus.  Attention was turned to the lower uterine segment where a transverse hysterotomy was made with a scalpel and extended bilaterally bluntly. The infant was successfully delivered, and cord was clamped and cut and infant was handed over to awaiting neonatology team. Uterine massage was then administered and the placenta delivered intact with three-vessel cord. The uterus was then cleared of clot and debris.  The hysterotomy was closed with 0 Vicryl in a running locked fashion, and an imbricating layer was also placed with a 0 Vicryl. Overall, excellent hemostasis was noted. The abdomen and the pelvis were cleared of all clot and debris and the Dawn Bullock was removed. The uterus was inspected and the descending colon was noted to have a small adhesion to the left broad ligament. Hemostasis was confirmed on all surfaces.  The peritoneum was reapproximated using 2-0 vicryl running stitches. The fascia was then closed using 0 Vicryl in a running fashion. The skin was closed with 4-0 vicryl. The patient tolerated the procedure well. Sponge, lap, instrument and needle counts were correct x 2. She was taken to the recovery room in stable condition.  Dawn Heritage, DO 10/31/2020 1:42 PM

## 2020-10-31 NOTE — Anesthesia Postprocedure Evaluation (Signed)
Anesthesia Post Note  Patient: Dawn Bullock  Procedure(s) Performed: CESAREAN SECTION     Patient location during evaluation: Mother Baby Anesthesia Type: Spinal Level of consciousness: oriented and awake and alert Pain management: pain level controlled Vital Signs Assessment: post-procedure vital signs reviewed and stable Respiratory status: spontaneous breathing and respiratory function stable Cardiovascular status: blood pressure returned to baseline and stable Postop Assessment: no headache, no backache, no apparent nausea or vomiting and able to ambulate Anesthetic complications: no   No notable events documented.  Last Vitals:  Vitals:   10/31/20 1400 10/31/20 1415  BP: 98/61 103/66  Pulse: 62 68  Resp: 16 17  Temp:  36.4 C  SpO2: 100% 100%    Last Pain:  Vitals:   10/31/20 1415  TempSrc: Oral  PainSc: 0-No pain   Pain Goal:                Epidural/Spinal Function Cutaneous sensation: Pins and Needles (10/31/20 1415), Patient able to flex knees: No (10/31/20 1415), Patient able to lift hips off bed: No (10/31/20 1415), Back pain beyond tenderness at insertion site: No (10/31/20 1415), Progressively worsening motor and/or sensory loss: No (10/31/20 1415), Bowel and/or bladder incontinence post epidural: No (10/31/20 1415)  Trevor Iha

## 2020-10-31 NOTE — Transfer of Care (Signed)
Immediate Anesthesia Transfer of Care Note  Patient: Dawn Bullock  Procedure(s) Performed: CESAREAN SECTION  Patient Location: PACU  Anesthesia Type:Spinal  Level of Consciousness: awake, alert  and oriented  Airway & Oxygen Therapy: Patient Spontanous Breathing  Post-op Assessment: Report given to RN and Post -op Vital signs reviewed and stable  Post vital signs: Reviewed and stable  Last Vitals:  Vitals Value Taken Time  BP 113/68 10/31/20 1347  Temp    Pulse 67 10/31/20 1351  Resp 19 10/31/20 1351  SpO2 99 % 10/31/20 1351  Vitals shown include unvalidated device data.  Last Pain:  Vitals:   10/31/20 1100  TempSrc: Oral         Complications: No notable events documented.

## 2020-10-31 NOTE — Anesthesia Procedure Notes (Signed)
Spinal  Patient location during procedure: OB Start time: 10/31/2020 12:30 PM End time: 10/31/2020 12:34 PM Reason for block: surgical anesthesia Staffing Performed: anesthesiologist  Anesthesiologist: Trevor Iha, MD Preanesthetic Checklist Completed: patient identified, IV checked, risks and benefits discussed, surgical consent, monitors and equipment checked, pre-op evaluation and timeout performed Spinal Block Patient position: sitting Prep: DuraPrep and site prepped and draped Patient monitoring: heart rate, cardiac monitor, continuous pulse ox and blood pressure Approach: midline Location: L3-4 Injection technique: single-shot Needle Needle type: Pencan  Needle gauge: 24 G Needle length: 10 cm Needle insertion depth: 5 cm Assessment Sensory level: T4 Events: CSF return Additional Notes  1Attempt (s). Pt tolerated procedure well.

## 2020-10-31 NOTE — Lactation Note (Signed)
This note was copied from a baby's chart. Lactation Consultation Note  Patient Name: Dawn Bullock [id number] Date: 10/31/2020 Reason for consult: Initial assessment;NICU baby;Early term 37-38.6wks Age:30 years  Used [employer] interpreter services for arabic, interpreter # [phone]  "Samir"  Visited with mom of 5 hours old ETI NICU female, she's a P2 and experienced BF. Baby was admitted to the NICU due to desaturations, LC set up mom with a DEBP, mom started pumping during [facility] consultation; praised her for her efforts.  Reviewed pumping schedule, expectations and breastmilk storage guidelines. Mom was anxious to know if she'll be going home with baby, LC advised her to check with baby's doctor for more details. [facility] faxed WIC referral to [facility] in [place].  Plan of care:  Encouraged mom to start pumping consistently every 3 hours, at least 8 pumping sessions/24 hours Once she starts getting drops, she'll take those to the refrigerator to be taken to her baby in the NICU  BF brochure, BF resources and NICU booklet were reviewed. No other support person at this time. All questions and concerns answered, mom to call NICU LC PRN.  Maternal Data Has patient been taught Hand Expression?: Yes Does the patient have breastfeeding experience prior to this delivery?: Yes How long did the patient breastfeed?: BF her baby for 2 years  Feeding Mother's Current Feeding Choice: Breast Milk  LATCH Score Latch: Grasps breast easily, tongue down, lips flanged, rhythmical sucking.  Audible Swallowing: A few with stimulation  Type of Nipple: Everted at rest and after stimulation  Comfort (Breast/Nipple): Soft / non-tender  Hold (Positioning): Assistance needed to correctly position infant at breast and maintain latch.  LATCH Score: 8   Lactation Tools Discussed/Used Tools: Pump;Flanges Flange Size: 24 Breast pump type: Double-Electric Breast Pump Pump Education: Setup, frequency,  and cleaning;Milk Storage Reason for Pumping: ETI in NICU Pumping frequency: q 3 hours  Interventions Interventions: Breast feeding basics reviewed;DEBP;Education  Discharge Pump: DEBP (no pump at home) [facility] Program: No  Consult Status Consult Status: Follow-up Follow-up type: In-patient    Dawn Bullock 10/31/2020, 6:03 PM

## 2020-11-01 LAB — CBC
HCT: 28.2 % — ABNORMAL LOW (ref 36.0–46.0)
Hemoglobin: 9.4 g/dL — ABNORMAL LOW (ref 12.0–15.0)
MCH: 29.3 pg (ref 26.0–34.0)
MCHC: 33.3 g/dL (ref 30.0–36.0)
MCV: 87.9 fL (ref 80.0–100.0)
Platelets: 210 10*3/uL (ref 150–400)
RBC: 3.21 MIL/uL — ABNORMAL LOW (ref 3.87–5.11)
RDW: 12.6 % (ref 11.5–15.5)
WBC: 14.4 10*3/uL — ABNORMAL HIGH (ref 4.0–10.5)
nRBC: 0 % (ref 0.0–0.2)

## 2020-11-01 MED ORDER — ENOXAPARIN SODIUM 40 MG/0.4ML IJ SOSY
40.0000 mg | PREFILLED_SYRINGE | INTRAMUSCULAR | Status: DC
  Administered 2020-11-02 – 2020-11-03 (×2): 40 mg via SUBCUTANEOUS
  Filled 2020-11-01 (×2): qty 0.4

## 2020-11-01 NOTE — Progress Notes (Addendum)
Post Operative Day 1 Subjective: up ad lib and tolerating PO. Lochia is normal. Patient states she has been feeling itching in the face that has gotten worse since her c-section. She states she has had a headache today that has been ongoing since 8mo of pregnancy and has not resolved. She attributes her headache to not wearing appropriate prescription glasses. Her appetite has gradually increased but is still mild. Patient states she has had two episodes of feeling dizzy when standing but attributes it to not eating and hydrating well post-op. She is eating most of her meals and has been hydrating since. Patient was feeling anxious about her other children being at home without her, but does not think it is a concern and does not feel the need to talk to someone about these feelings.  Objective: Blood pressure 107/64, pulse 60, temperature 98 F (36.7 C), temperature source Oral, resp. rate 16, height 5' 6" (1.676 m), weight 85.3 kg, last menstrual period 02/12/2020, SpO2 99 %, unknown if currently breastfeeding.  Physical Exam:  General: alert, cooperative, and no distress Lochia: appropriate Uterine Fundus: firm Incision: healing well, no significant drainage, mild strike through bleeding DVT Evaluation: No evidence of DVT seen on physical exam. No cords or calf tenderness. No significant calf/ankle edema.  Recent Labs    10/30/20 1113 11/01/20 0509  HGB 10.3* 9.4*  HCT 32.0* 28.2*    Assessment/Plan: Post-op -Patient had catheter removed this AM, will monitor for continued voiding. Patient is due to pass gas or have a BM, we will continue to monitor for milestones. -Hgb dropped mildly from 10.3>9.4, given she is having some dizziness with standing, will give oral iron today. -Giving patient Benadryl for facial itching -Circumcision and pp MMR prior to discharge and Contraception declined  2. Headache -Patients BPs have remained in the normal range. She has had a history of ongoing  headache during pregnancy and has not been hydrating well prior. Will continue to encourage hydrating and monitor her headache symptoms.    LOS: 1 day   Dawn Bullock 11/01/2020, 8:45 AM   Midwife attestation Post Partum/Postop Day 1 I have seen and examined this patient and agree with above documentation in the medical student's note.   Dawn Bullock is a 29 y.o. G3P2012 s/p primary C/S for prior uterine incision with cornual ectopic .  Pt denies problems with ambulating, voiding or po intake. Pain is well controlled.  Plan for birth control is condoms.  Method of Feeding: breast and bottle  PE:  BP 107/64 (BP Location: Right Arm)   Pulse 60   Temp 98 F (36.7 C) (Oral)   Resp 16   Ht 5' 6" (1.676 m)   Wt 85.3 kg   LMP 02/12/2020   SpO2 99%   Breastfeeding Unknown   BMI 30.34 kg/m  Gen: well appearing Heart: reg rate Lungs: normal WOB Fundus firm Ext: soft, no pain, no edema  Plan for discharge: 8/25 or 8/26 (Day 2 or Day 3 postop).   Dawn Bullock, CNM 9:51 AM  

## 2020-11-01 NOTE — Progress Notes (Signed)
Patient screened out for psychosocial assessment since none of the following apply:  Psychosocial stressors documented in mother or baby's chart  Gestation less than 32 weeks  Code at delivery   Infant with anomalies Please contact the Clinical Social Worker if specific needs arise, by MOB's request, or if MOB scores greater than 9/yes to question 10 on Edinburgh Postpartum Depression Screen.  Johannah Rozas, LCSW Clinical Social Worker [facility] Cell#: [phone]     

## 2020-11-02 ENCOUNTER — Encounter: Payer: Medicaid Other | Admitting: Obstetrics and Gynecology

## 2020-11-02 DIAGNOSIS — D62 Acute posthemorrhagic anemia: Secondary | ICD-10-CM | POA: Diagnosis not present

## 2020-11-02 MED ORDER — LACTATED RINGERS IV BOLUS
1000.0000 mL | Freq: Once | INTRAVENOUS | Status: AC
  Administered 2020-11-02: 1000 mL via INTRAVENOUS

## 2020-11-02 MED ORDER — SODIUM CHLORIDE 0.9 % IV SOLN
500.0000 mg | Freq: Once | INTRAVENOUS | Status: AC
  Administered 2020-11-02: 500 mg via INTRAVENOUS
  Filled 2020-11-02: qty 25

## 2020-11-02 MED ORDER — FERROUS SULFATE 325 (65 FE) MG PO TABS
325.0000 mg | ORAL_TABLET | ORAL | Status: DC
  Administered 2020-11-02: 325 mg via ORAL
  Filled 2020-11-02: qty 1

## 2020-11-02 NOTE — Progress Notes (Addendum)
Post Operative Day 2 Subjective: no complaints, up ad lib, voiding, tolerating PO, and + flatus Her only concern is the status of her baby in NICU. Her pain is well controlled.   Objective: Blood pressure 119/68, pulse 61, temperature 98.5 F (36.9 C), temperature source Oral, resp. rate 18, height 5' 6" (1.676 m), weight 85.3 kg, last menstrual period 02/12/2020, SpO2 98 %, unknown if currently breastfeeding.  Physical Exam:  General: alert, cooperative, and no distress Lochia: appropriate Uterine Fundus: firm Incision: healing well, no dehiscence DVT Evaluation: No evidence of DVT seen on physical exam.  Recent Labs    10/30/20 1113 11/01/20 0509  HGB 10.3* 9.4*  HCT 32.0* 28.2*    Assessment/Plan: Plan for discharge tomorrow. She is undecided regarding contraception, but will be abstinent from sex for 6 days for religious reasons.  Patient also amenable to post-partum MMR.     LOS: 2 days   Dawn Bullock 11/02/2020, 7:42 AM   Midwife attestation I have seen and examined this patient and agree with above documentation in the resident's note.   Post Operative Day 2  Dawn Bullock is a 30 y.o. [id number] s/p CS.  Reports dizziness and SOB when she ambulated since her CS. Pt denies problems with ambulating, voiding or po intake. She is passing flatus, no BM. Pain is well controlled. Method of Feeding: pumping. Admits to low water intake, only small amt with meals.   PE:  Gen: well appearing Heart: reg rate Lungs: normal WOB Fundus firm Abd: soft, NT, non-distended; dressing c/d/i with outlined drainage to left lateral not extended Ext: soft, no pain, no edema  Assessment: S/p CS PPD #2 ABL anemia  Plan for discharge: possibly tomorrow PO Fe- start today Orthostatic VS Increase water intake  Julianne Handler, CNM 12:34 PM

## 2020-11-02 NOTE — Progress Notes (Signed)
Patient requested NSL to be removed due to site being tender. NSL appears intact without redness. Informed patient that IV would have to restarted if IV meds are needed. Patient verbalized understanding. Encouraged po fluids, bottle of waters ordered with patient's meal tray and cups of water given.

## 2020-11-02 NOTE — Lactation Note (Signed)
This note was copied from a baby's chart. Lactation Consultation Note  Patient Name: Dawn Bullock HENID'P Date: 11/02/2020 Reason for consult: Follow-up assessment;NICU baby;Early term 37-38.6wks Age:32 hours  Interpretor: [employer]  I followed up with [name]. Her last pumping session was yesterday. I reviewed the rationale for pumping and helped her pump. We fitted her flanges. She states discomfort with both size 24 and 27 flanges. We used some coconut oil, and at the end of the session, she felt that the 27 flange was more comfortable. She pumped several cc's which we brought to NICU for oral care. I reviewed cleaning and reassembly of pump parts.  I encouraged her to pump every three hours (8 times a day). She is using the lowest pressure setting for her DEBP.   [name] breast fed her previous child, now 37 years old.   Maternal Data Does the patient have breastfeeding experience prior to this delivery?: Yes  Feeding Mother's Current Feeding Choice: Breast Milk and Formula   Lactation Tools Discussed/Used Tools: Pump;Coconut oil Flange Size: 27;24 Breast pump type: Double-Electric Breast Pump Pump Education: Setup, frequency, and cleaning Reason for Pumping: NICU; separation Pumping frequency: not since yesterday; recommended q3 Pumped volume: 3 mL  Interventions Interventions: DEBP;Education  Discharge    Consult Status Consult Status: Follow-up Date: 11/03/20 Follow-up type: In-patient    Walker Shadow 11/02/2020, 2:15 PM

## 2020-11-03 ENCOUNTER — Ambulatory Visit: Payer: Self-pay

## 2020-11-03 ENCOUNTER — Other Ambulatory Visit (HOSPITAL_BASED_OUTPATIENT_CLINIC_OR_DEPARTMENT_OTHER): Payer: Self-pay

## 2020-11-03 DIAGNOSIS — R519 Headache, unspecified: Secondary | ICD-10-CM | POA: Diagnosis present

## 2020-11-03 MED ORDER — IBUPROFEN 600 MG PO TABS
600.0000 mg | ORAL_TABLET | Freq: Four times a day (QID) | ORAL | 0 refills | Status: AC | PRN
  Filled 2020-11-03: qty 30, 8d supply, fill #0

## 2020-11-03 MED ORDER — SIMETHICONE 80 MG PO CHEW
80.0000 mg | CHEWABLE_TABLET | ORAL | 0 refills | Status: AC | PRN
  Filled 2020-11-03: qty 100, 30d supply, fill #0

## 2020-11-03 MED ORDER — POLYETHYLENE GLYCOL 3350 17 GM/SCOOP PO POWD
17.0000 g | Freq: Every day | ORAL | 1 refills | Status: AC | PRN
  Filled 2020-11-03: qty 238, 14d supply, fill #0

## 2020-11-03 MED ORDER — FERROUS SULFATE 325 (65 FE) MG PO TABS
325.0000 mg | ORAL_TABLET | ORAL | 3 refills | Status: AC
  Filled 2020-11-03: qty 100, 200d supply, fill #0

## 2020-11-03 MED ORDER — OXYCODONE-ACETAMINOPHEN 5-325 MG PO TABS
1.0000 | ORAL_TABLET | Freq: Four times a day (QID) | ORAL | 0 refills | Status: AC | PRN
  Filled 2020-11-03: qty 30, 4d supply, fill #0

## 2020-11-03 MED ORDER — PRENATAL MULTIVITAMIN CH: 1.0000 | ORAL_TABLET | Freq: Every day | ORAL | Status: AC

## 2020-11-03 NOTE — Progress Notes (Signed)
OB Note  With iPAD interpreter, I d/w patient re: circumcision and she desires it when okay with NICU, after d/w her re: r/b, namely risk of infection, bleeding, need for other procedures.  Cornelia Copa MD Attending Center for [employer] (Faculty Practice) 11/03/2020 Time: 1237pm

## 2020-11-03 NOTE — Lactation Note (Signed)
This note was copied from a baby's chart. Lactation Consultation Note  Patient Name: Boy Fredda Clarida VJKQA'S Date: 11/03/2020 Reason for consult: Follow-up assessment;NICU baby;Early term 37-38.6wks Age:30 years  FOB was in the phone and interpreting for this encounter. Visited with mom of 60 hours old ETI female, she was discharged today but told LC she hasn't called the [facility] office to set up an appt to P/U her DEBP. Referral was made on birth date 10/31/2020.  Mom hasn't been pumping consistently while at the hospital, explained to mom the importance of consistent pumping to protect her supply, she was instructed to use the hand pump in the meantime until she gets her pump from the [facility] office.  Plan of care:  Encouraged mom to start pumping consistently, at least 8 times/24 hours She'll call the [facility] office today to set up an appt and pick up her DEBP WIC pump  FOB was on the phone during [facility] consultation. Parents reported all questions and concerns were answered, they're both aware of LC OP services and will call PRN.  Maternal Data   Mom is at risk of low supply due to infrequent pumping  Feeding Mother's Current Feeding Choice: Breast Milk and Formula  Lactation Tools Discussed/Used Tools: Flanges;Pump;Coconut oil Flange Size: 24;27 Breast pump type: Double-Electric Breast Pump Pump Education: Setup, frequency, and cleaning;Milk Storage Reason for Pumping: ETI in NICU Pumping frequency: only once the last 24 hours Pumped volume: 12 mL  Interventions Interventions: Breast feeding basics reviewed;DEBP;Education  Discharge Discharge Education: Engorgement and breast care Pump: DEBP  Consult Status Consult Status: Follow-up Follow-up type: In-patient    Amol Domanski Venetia Constable 11/03/2020, 1:34 PM

## 2020-11-03 NOTE — Discharge Summary (Signed)
Postpartum Discharge Summary     Patient Name: Dawn Bullock DOB: August 25, 1990 MRN: [id number]  Date of admission: 10/31/2020 Delivering provider: Levie Heritage  Date of discharge: 11/03/2020  Admitting diagnosis: Scheduled c-section at 37/3 weeks   Additional problems: History of hysterotomy (treat as a classical incision)    Discharge diagnosis: Term Pregnancy Delivered. Headaches in pregnancy and postpartum.                                               Post partum procedures: None  Hospital course: Patient underwent an uncomplicated, scheduled c-section on the day of admission.  *PP: B POS. Breast. Meeting all PP/PO goals, including having a BM.  *Anemia: asymptomatic on day of discharge. S/p 8/25 venofer dose *Headache: pt states she had them during her pregnancy and still has some and describes it as frontal and and in the mid head b/l mostly. No gross focal deficits. She denies any s/s of a spinal headache. F/u at her incision check and if still symptomatic can refer to Neuro.   Physical exam  Vitals:   11/02/20 0643 11/02/20 1358 11/02/20 2120 11/03/20 0525  BP: 119/68 107/64 119/71 121/74  Pulse: 61 64 71 62  Resp: 18 18 18 17   Temp: 98.5 F (36.9 C) 97.8 F (36.6 C) 97.8 F (36.6 C) 97.8 F (36.6 C)  TempSrc: Oral Oral Oral Oral  SpO2: 98% 99% 97% 97%  Weight:      Height:       Gen: NAD Abdomen: Firm fundus below the umbilicus, nttp, mildly distended. C/d/I honeycomb incision. +BS Ext: no c/c/e  Labs: CBC Latest Ref Rng & Units 11/01/2020 10/30/2020 08/29/2020  WBC 4.0 - 10.5 K/uL 14.4(H) 9.7 10.8  Hemoglobin 12.0 - 15.0 g/dL 08/31/2020) 10.3(L) 10.8(L)  Hematocrit 36.0 - 46.0 % 28.2(L) 32.0(L) 31.6(L)  Platelets 150 - 400 K/uL 210 212 211    CMP Latest Ref Rng & Units 10/30/2020  Glucose 70 - 99 mg/dL 11/01/2020)  BUN 6 - 20 mg/dL 6  Creatinine 244(W - 1.02 mg/dL 7.25  Sodium 3.66 - 440 mmol/L 137  Potassium 3.5 - 5.1 mmol/L 3.7  Chloride 98 - 111  mmol/L 104  CO2 22 - 32 mmol/L 25  Calcium 8.9 - 10.3 mg/dL 347)  Total Protein 6.5 - 8.1 g/dL 6.3(L)  Total Bilirubin 0.3 - 1.2 mg/dL 0.5  Alkaline Phos 38 - 126 U/L 149(H)  AST 15 - 41 U/L 16  ALT 0 - 44 U/L 10   Edinburgh Score: Edinburgh Postnatal Depression Scale Screening Tool 10/31/2020  I have been able to laugh and see the funny side of things. 0  I have looked forward with enjoyment to things. 0  I have blamed myself unnecessarily when things went wrong. 0  I have been anxious or worried for no good reason. 0  I have felt scared or panicky for no good reason. 0  Things have been getting on top of me. 0  I have been so unhappy that I have had difficulty sleeping. 0  I have felt sad or miserable. 0  I have been so unhappy that I have been crying. 0  The thought of harming myself has occurred to me. 0  Edinburgh Postnatal Depression Scale Total 0     After visit meds:  Allergies as of 11/03/2020  No Known Allergies      Medication List     STOP taking these medications    Transderm-Scop 1 MG/3DAYS Generic drug: scopolamine       TAKE these medications    acetaminophen 500 MG tablet Commonly known as: TYLENOL Take 500-1,000 mg by mouth every 6 (six) hours as needed (for pain.).   ferrous sulfate 325 (65 FE) MG tablet Take 1 tablet (325 mg total) by mouth every other day. Take with vitamin c Start taking on: November 04, 2020   ibuprofen 600 MG tablet Commonly known as: ADVIL Take 1 tablet (600 mg total) by mouth every 6 (six) hours as needed for headache, mild pain or moderate pain.   oxyCODONE-acetaminophen 5-325 MG tablet Commonly known as: PERCOCET/ROXICET Take 1-2 tablets by mouth every 6 (six) hours as needed.   polyethylene glycol 17 g packet Commonly known as: MIRALAX / GLYCOLAX Take 17 g by mouth daily as needed.   prenatal multivitamin Tabs tablet Take 1 tablet by mouth daily at 12 noon.   simethicone 80 MG chewable tablet Commonly  known as: MYLICON Chew 1 tablet (80 mg total) by mouth as needed for flatulence.         Discharge home in stable condition Infant Feeding: Breast Infant Disposition:NICU Discharge instruction: per After Visit Summary and Postpartum booklet. Activity: Advance as tolerated. Pelvic rest for 6 weeks.  Diet: routine diet Future Appointments: for incision check Future Appointments  Date Time Provider Department Center  11/06/2020  9:15 AM Milas Hock, MD [facility] None   Follow up Visit:  Follow-up Information     [facility]. Go in 3 day(s).   Specialty: Obstetrics and Gynecology Why: incision check Contact information: [address] [place] [address] [phone]                Follow up: 8/29 for incision check.   11/03/2020 [place] Bing, MD

## 2020-11-06 ENCOUNTER — Ambulatory Visit (INDEPENDENT_AMBULATORY_CARE_PROVIDER_SITE_OTHER): Payer: Medicaid Other | Admitting: Obstetrics and Gynecology

## 2020-11-06 ENCOUNTER — Other Ambulatory Visit: Payer: Self-pay

## 2020-11-06 ENCOUNTER — Telehealth: Payer: Self-pay | Admitting: *Deleted

## 2020-11-06 ENCOUNTER — Other Ambulatory Visit (HOSPITAL_BASED_OUTPATIENT_CLINIC_OR_DEPARTMENT_OTHER): Payer: Self-pay

## 2020-11-06 ENCOUNTER — Encounter: Payer: Self-pay | Admitting: Obstetrics and Gynecology

## 2020-11-06 DIAGNOSIS — D62 Acute posthemorrhagic anemia: Secondary | ICD-10-CM | POA: Diagnosis not present

## 2020-11-06 MED ORDER — PREPLUS 27-1 MG PO TABS
1.0000 | ORAL_TABLET | Freq: Every day | ORAL | 13 refills | Status: AC
Start: 2020-11-06 — End: ?
  Filled 2020-11-06: qty 30, 30d supply, fill #0

## 2020-11-06 NOTE — Telephone Encounter (Signed)
Transition Care Management Follow-up Telephone Call Date of discharge and from where: 11/03/2020 - [facility] How have you been since you were released from the hospital? "I am okay" Any questions or concerns? No  Items Reviewed: Did the pt receive and understand the discharge instructions provided? Yes  Medications obtained and verified? Yes  Other? No  Any new allergies since your discharge? No  Dietary orders reviewed? No Do you have support at home? Yes    Functional Questionnaire: (I = Independent and D = Dependent) ADLs: I  Bathing/Dressing- I  Meal Prep- I  Eating- I  Maintaining continence- I  Transferring/Ambulation- I  Managing Meds- I  Follow up appointments reviewed:  PCP Hospital f/u appt confirmed? No   Specialist Hospital f/u appt confirmed? Yes  Scheduled to see OBGYN on 12/04/2020 @ 1025. Are transportation arrangements needed? No  If their condition worsens, is the pt aware to call PCP or go to the Emergency Dept.? Yes Was the patient provided with contact information for the PCP's office or ED? Yes Was to pt encouraged to call back with questions or concerns? Yes

## 2020-11-06 NOTE — Progress Notes (Signed)
   History:   Dawn Bullock is a 30 y.o. [id number] here today for incision check s/p 1LTCS due to h/o cornual ectopic surgery. She denies any abnormal vaginal discharge, bleeding, pelvic pain or other concerns.  Her son is in the NICU. Postop course noteworth for postop anemia for which she is takign iron. She has some LE swelling but no calf pain. Notes it is primarily in her feet.   The following portions of the patient's history were reviewed and updated as appropriate: allergies, current medications, past family history, past medical history, past social history, past surgical history and problem list.   Review of Systems:  Pertinent items noted in HPI and remainder of comprehensive ROS otherwise negative.  Physical Exam:  BP 117/76   Pulse 78  CONSTITUTIONAL: Well-developed, well-nourished female in no acute distress.  HEENT:  Normocephalic, atraumatic. External right and left ear normal. No scleral icterus.  NECK: Normal range of motion, supple, no masses noted on observation SKIN: No rash noted. Not diaphoretic. No erythema. No pallor. MUSCULOSKELETAL: Normal range of motion. No edema noted. NEUROLOGIC: Alert and oriented to person, place, and time. Normal muscle tone coordination. No cranial nerve deficit noted. PSYCHIATRIC: Normal mood and affect. Normal behavior. Normal judgment and thought content. Incision is healing well, glue noted over the incision. No erythema or tenderness.  LE: equal swelling by measurement. No pain in calves.        Assessment and Plan:    1. Postpartum care and examination - Continue routine PP care. Restrictions reviewed.  - She does not have Rx for prenatal so sent to her pharmacy.  - Baby is still in the NICU - she is having some hard time with this since she cannot drive and has another child at home.  - Reassuring about swelling - no pain and equal on both sides.  - Prenatal Vit-Fe Fumarate-FA (PREPLUS) 27-1 MG TABS; Take 1 tablet by  mouth daily.  Dispense: 30 tablet; Refill: 13  2. Acute blood loss anemia - Continue Iron until at least PP visit - which would be in about 4 weeks.    Routine preventative health maintenance measures emphasized. Please refer to After Visit Summary for other counseling recommendations.   Return in about 4 weeks (around 12/04/2020), or postpartum, for OB VISIT, MD or APP.     Milas Hock, MD, FACOG Obstetrician & Gynecologist, [facility] for [facility], [facility]

## 2020-11-07 ENCOUNTER — Ambulatory Visit: Payer: Self-pay

## 2020-11-07 ENCOUNTER — Other Ambulatory Visit (HOSPITAL_BASED_OUTPATIENT_CLINIC_OR_DEPARTMENT_OTHER): Payer: Self-pay

## 2020-11-07 NOTE — Lactation Note (Signed)
This note was copied from a baby's chart. Lactation Consultation Note  Patient Name: Dawn Bullock [id number] Date: 11/07/2020 Reason for consult: Follow-up assessment;NICU baby;Early term 37-38.6wks Age:32 days  Vickki Muff interpreter services for Arabic, interpreter # [phone]  Visited with mom of 19 days old ETI female, baby is going home today. Mom reported that she never picked up her pump from [facility] and she's been using her hand pump this time.  She also reports taking baby to breast. Encouraged her to continue pumping after feedings at the breast to protect her supply and to not only rely on baby.  All questions and concerns answered, mom to call NICU LC PRN.  Maternal Data   Mom's supply is WNL; although unsure abou the total amounts in 24 hours  Feeding Mother's Current Feeding Choice: Breast Milk and Formula Nipple Type: Dr. Lorne Skeens  Lactation Tools Discussed/Used Tools: Flanges;Pump Flange Size: 24;27 Breast pump type: Double-Electric Breast Pump Pump Education: Setup, frequency, and cleaning;Milk Storage Reason for Pumping: ETI in NICU Pumping frequency: not sure, only pumping at home with a hand pump Pumped volume: 60 mL  Interventions Interventions: Breast feeding basics reviewed;DEBP;Education  Discharge Pump: DEBP;Personal (Ordered DEBP though her insurance, coming in the mail as on 11/07/20)  Consult Status Consult Status: Complete Follow-up type: Call as needed    Dawn Bullock Venetia Constable 11/07/2020, 5:51 PM

## 2020-11-10 ENCOUNTER — Telehealth (HOSPITAL_COMMUNITY): Payer: Self-pay | Admitting: *Deleted

## 2020-11-10 NOTE — Telephone Encounter (Signed)
Interpreter reports feeling well. Incision is healing with no issues. EPDS=3 [facility] score = 0) Interpreter reports baby is home from NICU and is well. Feeding, peeing, and pooping without difficulty.  Duffy Rhody, RN 11-10-2020 at 1:12pm

## 2020-11-17 ENCOUNTER — Other Ambulatory Visit (HOSPITAL_BASED_OUTPATIENT_CLINIC_OR_DEPARTMENT_OTHER): Payer: Self-pay

## 2020-12-04 ENCOUNTER — Ambulatory Visit: Payer: Medicaid Other | Admitting: Family Medicine

## 2020-12-12 ENCOUNTER — Ambulatory Visit (INDEPENDENT_AMBULATORY_CARE_PROVIDER_SITE_OTHER): Payer: Medicaid Other | Admitting: Advanced Practice Midwife

## 2020-12-12 ENCOUNTER — Other Ambulatory Visit: Payer: Self-pay

## 2020-12-12 ENCOUNTER — Encounter: Payer: Self-pay | Admitting: Advanced Practice Midwife

## 2020-12-12 VITALS — BP 114/75 | HR 70 | Wt 175.0 lb

## 2020-12-12 DIAGNOSIS — Z98891 History of uterine scar from previous surgery: Secondary | ICD-10-CM | POA: Diagnosis not present

## 2020-12-12 DIAGNOSIS — Z603 Acculturation difficulty: Secondary | ICD-10-CM | POA: Diagnosis not present

## 2020-12-12 NOTE — Progress Notes (Signed)
    Post Partum Visit Note  Dawn Bullock is a 30 y.o. [id number] female who presents for a postpartum visit. She is 6 weeks postpartum following a primary cesarean section.  I have fully reviewed the prenatal and intrapartum course. The delivery was at 37 gestational weeks.  Anesthesia: spinal. Postpartum course has been uneventful. Baby is doing well  . Baby is feeding by both breast and bottle - Similac Advance. Bleeding staining only. Bowel function is normal. Bladder function is normal. Patient is not sexually active. Contraception method is none. Postpartum depression screening: negative.   The pregnancy intention screening data noted above was reviewed. Potential methods of contraception were discussed. The patient elected to proceed with No data recorded.    Health Maintenance Due  Topic Date Due   COVID-19 Vaccine (1) Never done   INFLUENZA VACCINE  Never done    The following portions of the patient's history were reviewed and updated as appropriate: allergies, current medications, past family history, past medical history, past social history, past surgical history, and problem list.  Review of Systems Pertinent items noted in HPI and remainder of comprehensive ROS otherwise negative.  Objective:  There were no vitals taken for this visit.   General:  alert, cooperative, and no distress   Breasts:  normal  Lungs: Respirations unlabored  Heart:  Normal rate  Abdomen: soft, non-tender; bowel sounds normal; no masses,  no organomegaly and Incision well healed    Wound well approximated incision, wound edges not approximated  GU exam:  Deferred, not indicated       Assessment:    There are no diagnoses linked to this encounter.  Normal  postpartum exam.   Plan:   Essential components of care per ACOG recommendations:  1.  Mood and well being: Patient with negative depression screening today. Reviewed local resources for support.  - Patient tobacco use? No.    - hx of drug use? No.    2. Infant care and feeding:  -Patient currently breastmilk feeding? Yes. Reviewed importance of draining breast regularly to support lactation.  -Social determinants of health (SDOH) reviewed in EPIC. No concerns  3. Sexuality, contraception and birth spacing - Patient does not want a pregnancy in the next year.  Desired family size is 2 children.  - Reviewed forms of contraception in tiered fashion. Patient desired no method today.  States her "husband will take care of it"   Discussed she can get pregnant if unprotected - Discussed birth spacing of 18 months  4. Sleep and fatigue -Encouraged family/partner/community support of 4 hrs of uninterrupted sleep to help with mood and fatigue  5. Physical Recovery  - Discussed patients delivery and complications.  - Patient had a C-section repeat; no problems after deliver.  Patient expressed understanding - Patient has urinary incontinence? No. - Patient is safe to resume physical and sexual activity  6.  Health Maintenance - HM due items addressed No -   - Last pap smear  Diagnosis  Date Value Ref Range Status  04/26/2020 (A)  Final   - Atypical squamous cells of undetermined significance (ASC-US)   Pap smear not done at today's visit.   MD reviewed, recommended repeat 3 yrs  -Breast Cancer screening indicated? No.  - Aviva Signs, CNM  Mikey Bussing, CMA [facility] [employer]

## 2020-12-15 ENCOUNTER — Encounter: Payer: Self-pay | Admitting: Radiology

## 2021-10-09 ENCOUNTER — Encounter: Payer: Self-pay | Admitting: General Practice

## 2022-07-16 ENCOUNTER — Telehealth: Payer: Self-pay

## 2022-07-16 NOTE — Telephone Encounter (Signed)
LVM for patient to call back. AS, CMA
